# Patient Record
Sex: Female | Born: 1980 | Race: Black or African American | Hispanic: No | Marital: Single | Smoking: Never smoker
Health system: Southern US, Community
[De-identification: ages and names within clinical notes are randomized; demographics above are authoritative.]

## PROBLEM LIST (undated history)

## (undated) DIAGNOSIS — F419 Anxiety disorder, unspecified: Secondary | ICD-10-CM

## (undated) DIAGNOSIS — M797 Fibromyalgia: Secondary | ICD-10-CM

## (undated) DIAGNOSIS — E0590 Thyrotoxicosis, unspecified without thyrotoxic crisis or storm: Secondary | ICD-10-CM

## (undated) DIAGNOSIS — C50919 Malignant neoplasm of unspecified site of unspecified female breast: Secondary | ICD-10-CM

## (undated) DIAGNOSIS — G629 Polyneuropathy, unspecified: Secondary | ICD-10-CM

## (undated) DIAGNOSIS — J45909 Unspecified asthma, uncomplicated: Secondary | ICD-10-CM

## (undated) DIAGNOSIS — I2699 Other pulmonary embolism without acute cor pulmonale: Secondary | ICD-10-CM

## (undated) DIAGNOSIS — C801 Malignant (primary) neoplasm, unspecified: Secondary | ICD-10-CM

## (undated) DIAGNOSIS — G43909 Migraine, unspecified, not intractable, without status migrainosus: Secondary | ICD-10-CM

## (undated) HISTORY — PX: KNEE SURGERY: SHX244

## (undated) HISTORY — PX: CHOLECYSTECTOMY: SHX55

## (undated) HISTORY — PX: OVARY SURGERY: SHX727

## (undated) HISTORY — PX: TOOTH EXTRACTION: SUR596

## (undated) HISTORY — PX: TONSILLECTOMY: SUR1361

---

## 2003-04-11 ENCOUNTER — Ambulatory Visit (HOSPITAL_COMMUNITY): Admission: RE | Admit: 2003-04-11 | Discharge: 2003-04-12 | Payer: Self-pay | Admitting: Family Medicine

## 2014-05-04 HISTORY — PX: BILATERAL SALPINGECTOMY: SHX5743

## 2019-03-11 ENCOUNTER — Emergency Department (HOSPITAL_COMMUNITY): Payer: Self-pay

## 2019-03-11 ENCOUNTER — Other Ambulatory Visit: Payer: Self-pay

## 2019-03-11 ENCOUNTER — Emergency Department (HOSPITAL_COMMUNITY)
Admission: EM | Admit: 2019-03-11 | Discharge: 2019-03-11 | Disposition: A | Discharge status: Home / Self Care | Payer: Self-pay | Attending: Emergency Medicine | Admitting: Emergency Medicine

## 2019-03-11 ENCOUNTER — Encounter (HOSPITAL_COMMUNITY): Payer: Self-pay

## 2019-03-11 DIAGNOSIS — R1031 Right lower quadrant pain: Secondary | ICD-10-CM

## 2019-03-11 DIAGNOSIS — Z79899 Other long term (current) drug therapy: Secondary | ICD-10-CM | POA: Insufficient documentation

## 2019-03-11 DIAGNOSIS — N83291 Other ovarian cyst, right side: Secondary | ICD-10-CM | POA: Insufficient documentation

## 2019-03-11 DIAGNOSIS — N83201 Unspecified ovarian cyst, right side: Secondary | ICD-10-CM

## 2019-03-11 LAB — COMPREHENSIVE METABOLIC PANEL
ALT: 19 U/L (ref 0–44)
AST: 23 U/L (ref 15–41)
Albumin: 3.9 g/dL (ref 3.5–5.0)
Alkaline Phosphatase: 33 U/L — ABNORMAL LOW (ref 38–126)
Anion gap: 9 (ref 5–15)
BUN: 10 mg/dL (ref 6–20)
CO2: 20 mmol/L — ABNORMAL LOW (ref 22–32)
Calcium: 8.9 mg/dL (ref 8.9–10.3)
Chloride: 109 mmol/L (ref 98–111)
Creatinine, Ser: 0.97 mg/dL (ref 0.44–1.00)
GFR calc Af Amer: >60 mL/min (ref >60)
GFR calc non Af Amer: >60 mL/min (ref >60)
Glucose, Bld: 158 mg/dL — ABNORMAL HIGH (ref 70–99)
Potassium: 3.6 mmol/L (ref 3.5–5.1)
Sodium: 138 mmol/L (ref 135–145)
Total Bilirubin: 1.5 mg/dL — ABNORMAL HIGH (ref 0.3–1.2)
Total Protein: 6.8 g/dL (ref 6.5–8.1)

## 2019-03-11 LAB — CBC
HCT: 40.3 % (ref 36.0–46.0)
Hemoglobin: 13.3 g/dL (ref 12.0–15.0)
MCH: 34.4 pg — ABNORMAL HIGH (ref 26.0–34.0)
MCHC: 33 g/dL (ref 30.0–36.0)
MCV: 104.1 fL — ABNORMAL HIGH (ref 80.0–100.0)
Platelets: 306 10*3/uL (ref 150–400)
RBC: 3.87 MIL/uL (ref 3.87–5.11)
RDW: 12.4 % (ref 11.5–15.5)
WBC: 11.1 10*3/uL — ABNORMAL HIGH (ref 4.0–10.5)
nRBC: 0 % (ref 0.0–0.2)

## 2019-03-11 LAB — URINALYSIS, ROUTINE W REFLEX MICROSCOPIC
Bacteria, UA: NONE SEEN
Bilirubin Urine: NEGATIVE
Glucose, UA: NEGATIVE mg/dL
Ketones, ur: NEGATIVE mg/dL
Leukocytes,Ua: NEGATIVE
Nitrite: NEGATIVE
Protein, ur: 30 mg/dL — AB
Specific Gravity, Urine: 1.034 — ABNORMAL HIGH (ref 1.005–1.030)
pH: 7 (ref 5.0–8.0)

## 2019-03-11 LAB — LIPASE, BLOOD: Lipase: 24 U/L (ref 11–51)

## 2019-03-11 LAB — I-STAT BETA HCG BLOOD, ED (MC, WL, AP ONLY): I-stat hCG, quantitative: <5 m[IU]/mL (ref <5)

## 2019-03-11 MED ORDER — ONDANSETRON 4 MG PO TBDP
4.0000 mg | ORAL_TABLET | Freq: Once | ORAL | Status: AC | PRN
  Administered 2019-03-11: 4 mg via ORAL
  Filled 2019-03-11: qty 1

## 2019-03-11 MED ORDER — KETOROLAC TROMETHAMINE 30 MG/ML IJ SOLN
30.0000 mg | Freq: Once | INTRAMUSCULAR | Status: AC
  Administered 2019-03-11: 30 mg via INTRAVENOUS
  Filled 2019-03-11: qty 1

## 2019-03-11 MED ORDER — MORPHINE SULFATE (PF) 4 MG/ML IV SOLN
4.0000 mg | Freq: Once | INTRAVENOUS | Status: AC
  Administered 2019-03-11: 4 mg via INTRAVENOUS
  Filled 2019-03-11: qty 1

## 2019-03-11 MED ORDER — ONDANSETRON HCL 4 MG/2ML IJ SOLN
4.0000 mg | Freq: Once | INTRAMUSCULAR | Status: AC
  Administered 2019-03-11: 4 mg via INTRAVENOUS
  Filled 2019-03-11: qty 2

## 2019-03-11 MED ORDER — ONDANSETRON 4 MG PO TBDP: 4.0000 mg | ORAL_TABLET | Freq: Three times a day (TID) | ORAL | 0 refills | Status: AC | PRN

## 2019-03-11 MED ORDER — NAPROXEN 500 MG PO TABS: 500.0000 mg | ORAL_TABLET | Freq: Two times a day (BID) | ORAL | 0 refills | Status: AC | PRN

## 2019-03-11 MED ORDER — SODIUM CHLORIDE 0.9% FLUSH: 3.0000 mL | Freq: Once | INTRAVENOUS | Status: DC

## 2019-03-11 MED ORDER — HYDROMORPHONE HCL 1 MG/ML IJ SOLN
1.0000 mg | Freq: Once | INTRAMUSCULAR | Status: AC
  Administered 2019-03-11: 1 mg via INTRAVENOUS
  Filled 2019-03-11: qty 1

## 2019-03-11 NOTE — ED Notes (Signed)
Patient transported to Ultrasound 

## 2019-03-11 NOTE — Discharge Instructions (Addendum)
You have been seen today for abdominal pain. Please read and follow all provided instructions. Return to the emergency room for worsening condition or new concerning symptoms including worsening pain, fever, chills, nausea and vomiting that you cannot control.  Your ultrasound today shows 2 large cysts on your right ovary.  1. Medications:  Prescription has been sent to your pharmacy for naproxen.  This is an anti-inflammatory medication.  Please take as prescribed for pain.  Do not take any other anti-inflammatory medications while taking this such as Advil, Motrin, Aleve, ibuprofen.  Take this medicine with food as it can cause upset stomach. -Prescription has also been sent for Zofran.  This is a nausea medicine.  Please take as prescribed if needed.  Continue usual home medications Take medications as prescribed. Please review all of the medicines and only take them if you do not have an allergy to them.  2. Treatment: rest, drink plenty of fluids 3. Follow Up: Please follow up with your gynecologist in 1 to 2 days.  Call the office on Monday morning to schedule follow-up appointment.  It is also a possibility that you have an allergic reaction to any of the medicines that you have been prescribed - Everybody reacts differently to medications and while MOST people have no trouble with most medicines, you may have a reaction such as nausea, vomiting, rash, swelling, shortness of breath. If this is the case, please stop taking the medicine immediately and contact your physician.  ?

## 2019-03-11 NOTE — ED Triage Notes (Signed)
Pt reports RLQ pain since this morning, pt also started her menstrual cycle but states the pain feels different. Nausea no vomiting, pt a.o, nad noted

## 2019-03-11 NOTE — ED Provider Notes (Signed)
EMERGENCY DEPARTMENT Provider Note   CSN: [id number] Arrival date & time: 03/11/19  1209     History   Chief Complaint Chief Complaint  Patient presents with  . Abdominal Pain    HPI Latasha Brown is a 38 y.o. female past medical history significant for fibromyalgia, anxiety, IBS presents to emergency department today with chief complaint abdominal pain.  It was acute starting 3 hours prior to arrival.  Pain is located in right lower quadrant.  She states it is been constant and progressively worsening since onset.  She describes the pain as sharp. Pain is 9/10 in severity.  Tried taking her home dose of tramadol without symptom improvement.  She states her menstrual cycle started today but the pain she is having feels different than her typical menstrual cramps.  She denies fever, chills, chest pain, shortness of breath, nausea, vomiting, dysuria, urinary frequency, diarrhea, pelvic pain, vaginal bleeding, vaginal discharge.  Abdominal surgical history includes cholecystectomy. Patient does not have fallopian tubes or left ovary.  Does have a right ovary and uterus.   History reviewed. No pertinent past medical history.  There are no active problems to display for this patient.   Past Surgical History:  Procedure Laterality Date  . CHOLECYSTECTOMY    . OVARY SURGERY       OB History   No obstetric history on file.      Home Medications    Prior to Admission medications   Medication Sig Start Date End Date Taking? Authorizing Provider  acetaminophen (TYLENOL) 325 MG tablet Take 650 mg by mouth every 6 (six) hours as needed.   Yes [provider]  albuterol (VENTOLIN HFA) 108 (90 Base) MCG/ACT inhaler Inhale 2 puffs into the lungs daily as needed for shortness of breath. 05/12/18  Yes [provider]  citalopram (CELEXA) 40 MG tablet Take 1 tablet by mouth daily. 03/02/19 02/25/20 Yes [provider]  ibuprofen  (ADVIL) 200 MG tablet Take 600 mg by mouth every 6 (six) hours as needed for moderate pain.   Yes [provider]  norethindrone (MICRONOR) 0.35 MG tablet Take 1 tablet by mouth daily. 05/12/18 05/12/19 Yes [provider]  SUMAtriptan (IMITREX) 50 MG tablet Take 50 mg by mouth daily as needed for migraine. 12/07/18  Yes [provider]  topiramate (TOPAMAX) 25 MG tablet Take 1 tablet by mouth daily. 02/02/19 02/02/20 Yes [provider]  traMADol (ULTRAM) 50 MG tablet Take 1 tablet by mouth daily as needed for pain. 08/17/16  Yes [provider]  naproxen (NAPROSYN) 500 MG tablet Take 1 tablet (500 mg total) by mouth 2 (two) times daily as needed for up to 14 days. 03/11/19 03/25/19  Albrizze, Kaitlyn E, PA-C  ondansetron (ZOFRAN ODT) 4 MG disintegrating tablet Take 1 tablet (4 mg total) by mouth every 8 (eight) hours as needed for nausea or vomiting. 03/11/19   Albrizze, Harley Hallmark, PA-C    Family History No family history on file.  Social History Social History   Tobacco Use  . Smoking status: Not on file  Substance Use Topics  . Alcohol use: Not on file  . Drug use: Not on file     Allergies   Trazodone and nefazodone   Review of Systems Review of Systems  Constitutional: Negative for chills and fever.  HENT: Negative for congestion, ear discharge, ear pain, sinus pressure, sinus pain and sore throat.   Eyes: Negative for pain and redness.  Respiratory: Negative for cough and shortness of breath.   Cardiovascular: Negative for chest pain.  Gastrointestinal: Positive for abdominal pain. Negative for constipation, diarrhea, nausea and vomiting.  Genitourinary: Negative for dysuria and hematuria.  Musculoskeletal: Negative for back pain and neck pain.  Skin: Negative for wound.  Neurological: Negative for weakness, numbness and headaches.     Physical Exam Updated Vital Signs BP 137/75   Pulse 74   Temp 97.8 F (36.6 C) (Oral)   Resp  18   LMP 03/11/2019   SpO2 99%   Physical Exam Vitals signs and nursing note reviewed.  Constitutional:      General: She is not in acute distress.    Appearance: She is not ill-appearing.  HENT:     Head: Normocephalic and atraumatic.     Right Ear: Tympanic membrane and external ear normal.     Left Ear: Tympanic membrane and external ear normal.     Nose: Nose normal.     Mouth/Throat:     Mouth: Mucous membranes are moist.     Pharynx: Oropharynx is clear.  Eyes:     General: No scleral icterus.       Right eye: No discharge.        Left eye: No discharge.     Extraocular Movements: Extraocular movements intact.     Conjunctiva/sclera: Conjunctivae normal.     Pupils: Pupils are equal, round, and reactive to light.  Neck:     Musculoskeletal: Normal range of motion.     Vascular: No JVD.  Cardiovascular:     Rate and Rhythm: Normal rate and regular rhythm.     Pulses: Normal pulses.          Radial pulses are 2+ on the right side and 2+ on the left side.     Heart sounds: Normal heart sounds.  Pulmonary:     Comments: Lungs clear to auscultation in all fields. Symmetric chest rise. No wheezing, rales, or rhonchi. Abdominal:     General: Bowel sounds are normal.     Tenderness: There is abdominal tenderness in the right lower quadrant. There is no right CVA tenderness or left CVA tenderness. Negative signs include Rovsing's sign, McBurney's sign and psoas sign.     Comments: Abdomen is soft, non-distended, and non-tender in all quadrants. No rigidity, no guarding. No peritoneal signs.  Genitourinary:    Comments: Pt declines pelvic exam Musculoskeletal: Normal range of motion.  Skin:    General: Skin is warm and dry.     Capillary Refill: Capillary refill takes less than 2 seconds.  Neurological:     Mental Status: She is oriented to person, place, and time.     GCS: GCS eye subscore is 4. GCS verbal subscore is 5. GCS motor subscore is 6.     Comments: Fluent  speech, no facial droop.  Psychiatric:        Behavior: Behavior normal.      ED Treatments / Results  Labs (all labs ordered are listed, but only abnormal results are displayed) Labs Reviewed  COMPREHENSIVE METABOLIC PANEL - Abnormal; Notable for the following components:      Result Value   CO2 20 (*)    Glucose, Bld 158 (*)    Alkaline Phosphatase 33 (*)    Total Bilirubin 1.5 (*)    All other components within normal limits  CBC - Abnormal; Notable for the following components:   WBC 11.1 (*)    MCV 104.1 (*)  MCH 34.4 (*)    All other components within normal limits  URINALYSIS, ROUTINE W REFLEX MICROSCOPIC - Abnormal; Notable for the following components:   APPearance HAZY (*)    Specific Gravity, Urine 1.034 (*)    Hgb urine dipstick MODERATE (*)    Protein, ur 30 (*)    All other components within normal limits  LIPASE, BLOOD  I-STAT BETA HCG BLOOD, ED ([facility], [facility], [facility] ONLY)    EKG None  Radiology US Pelvic Complete W Transvaginal And Torsion R/o  Result Date: 03/11/2019 CLINICAL DATA:  Patient right-sided pelvic pain. EXAM: TRANSABDOMINAL AND TRANSVAGINAL ULTRASOUND OF PELVIS DOPPLER ULTRASOUND OF OVARIES TECHNIQUE: Both transabdominal and transvaginal ultrasound examinations of the pelvis were performed. Transabdominal technique was performed for global imaging of the pelvis including uterus, ovaries, adnexal regions, and pelvic cul-de-sac. It was necessary to proceed with endovaginal exam following the transabdominal exam to visualize the adnexal structure. Color and duplex Doppler ultrasound was utilized to evaluate blood flow to the ovaries. COMPARISON:  None. FINDINGS: Uterus Measurements: 9.7 x 3.9 x 4.2 cm = volume: 82.5 mL. No fibroids or other mass visualized. Endometrium Thickness: 6 mm.  No focal abnormality visualized. Right ovary Measurements: 7.6 x 6.7 x 4.5 cm = volume: 120.3 mL. Within the right ovary there is a 4.2 x 3.1 x 4.0 cm mildly complicated cyst  and a 3.8 x 3.8 x 4.0 cm mildly complicated cyst. Left ovary Surgically absent Pulsed Doppler evaluation of the right ovary demonstrates normal low-resistance arterial and venous waveforms. Other findings Trace fluid in the pelvis. IMPRESSION: The right ovary is enlarged measuring up to 7 cm and contains 2 large mildly complicated cysts. Arterial and venous flow is demonstrated within the right ovary on current examination. Given the size of the ovary and associated cysts, intermittent torsion as a cause of pain is not entirely excluded. Recommend follow-up pelvic ultrasound in 6-8 weeks to assess for interval improvement/resolution of large cysts within the right ovary. Electronically Signed   By: Lovey Newcomer M.D.   On: 03/11/2019 15:33    Procedures Procedures (including critical care time)  Medications Ordered in ED Medications  ondansetron (ZOFRAN-ODT) disintegrating tablet 4 mg (4 mg Oral Given 03/11/19 1228)  morphine 4 MG/ML injection 4 mg (4 mg Intravenous Given 03/11/19 1432)  ketorolac (TORADOL) 30 MG/ML injection 30 mg (30 mg Intravenous Given 03/11/19 1722)  HYDROmorphone (DILAUDID) injection 1 mg (1 mg Intravenous Given 03/11/19 1816)  ondansetron (ZOFRAN) injection 4 mg (4 mg Intravenous Given 03/11/19 1816)     Initial Impression / Assessment and Plan / ED Course  I have reviewed the triage vital signs and the nursing notes.  Pertinent labs & imaging results that were available during my care of the patient were reviewed by me and considered in my medical decision making (see chart for details).   Patient presents to the ED with complaints of abdominal pain. Patient nontoxic appearing, in no apparent distress, vitals WNL. On exam patient tender to RLQ, no peritoneal signs. Will evaluate with labs and pelvic US to rule out torsion. Analgesics, anti-emetics, and fluids administered.   Labs reviewed and grossly unremarkable. Mild leukocytosis of 11.1. Hemoglobin stable at 13.3. No  significant electrolyte derangements. LFTs, renal function, and lipase WNL. Urinalysis without obvious infection, does have moderate blood consistent with current menses.  US shows right ovary with 2 large complicated cysts. Radiologist comments on arterial and venous flow seen to right ovary making torsion less likely.  Pt updated  on US findings. She reports she knew of one cyst, but not the other. She had pelvic exam last week by pcp for annual exam. She declines pelvic exam today. Pt given dilaudid and toradol for pain with symptom improvement. She is requesting to be discharged home now that pain has improved. She is tolerating PO intake while in ED. Repeat abdominal exam is benign.  Will discharge home with supportive measures including Naproxen. I discussed results, treatment plan, need for close PCP and gyn follow-up, and return precautions with the patient. She was informed of recommendation for repeat US in 6-8 weeks. Provided opportunity for questions, patient confirmed understanding and is in agreement with plan.     Portions of this note were generated with [employer]. Dictation errors may occur despite best attempts at proofreading.    Final Clinical Impressions(s) / ED Diagnoses   Final diagnoses:  Cyst of right ovary    ED Discharge Orders         Ordered    naproxen (NAPROSYN) 500 MG tablet  2 times daily PRN     03/11/19 1751    ondansetron (ZOFRAN ODT) 4 MG disintegrating tablet  Every 8 hours PRN     03/11/19 1754           Albrizze, Harley Hallmark, PA-C 03/11/19 2228    Lennice Sites, DO 03/12/19 1243

## 2019-10-29 ENCOUNTER — Emergency Department (HOSPITAL_COMMUNITY)
Admission: EM | Admit: 2019-10-29 | Discharge: 2019-10-30 | Disposition: A | Discharge status: Home / Self Care | Payer: Self-pay | Attending: Emergency Medicine | Admitting: Emergency Medicine

## 2019-10-29 ENCOUNTER — Encounter (HOSPITAL_COMMUNITY): Payer: Self-pay | Admitting: Emergency Medicine

## 2019-10-29 ENCOUNTER — Other Ambulatory Visit: Payer: Self-pay

## 2019-10-29 DIAGNOSIS — Z79899 Other long term (current) drug therapy: Secondary | ICD-10-CM | POA: Insufficient documentation

## 2019-10-29 DIAGNOSIS — G43809 Other migraine, not intractable, without status migrainosus: Secondary | ICD-10-CM | POA: Insufficient documentation

## 2019-10-29 HISTORY — DX: Migraine, unspecified, not intractable, without status migrainosus: G43.909

## 2019-10-29 HISTORY — DX: Anxiety disorder, unspecified: F41.9

## 2019-10-29 HISTORY — DX: Unspecified asthma, uncomplicated: J45.909

## 2019-10-29 HISTORY — DX: Fibromyalgia: M79.7

## 2019-10-29 NOTE — ED Triage Notes (Signed)
Pt reports migraine since Friday.  States she has a history of migraine and had refills on Imitrex but the Rx expired.  Reports nausea and light sensitivity.  Took nausea medication this morning with some relief or nausea.

## 2019-10-30 MED ORDER — SUMATRIPTAN SUCCINATE 50 MG PO TABS: 50.0000 mg | ORAL_TABLET | Freq: Every day | ORAL | 0 refills | Status: AC | PRN

## 2019-10-30 MED ORDER — KETOROLAC TROMETHAMINE 60 MG/2ML IM SOLN
60.0000 mg | Freq: Once | INTRAMUSCULAR | Status: AC
  Administered 2019-10-30: 60 mg via INTRAMUSCULAR
  Filled 2019-10-30: qty 2

## 2019-10-30 MED ORDER — SUMATRIPTAN SUCCINATE 6 MG/0.5ML ~~LOC~~ SOLN
6.0000 mg | Freq: Once | SUBCUTANEOUS | Status: AC
  Administered 2019-10-30: 6 mg via SUBCUTANEOUS
  Filled 2019-10-30: qty 0.5

## 2019-10-30 NOTE — ED Provider Notes (Signed)
[facility] EMERGENCY DEPARTMENT Provider Note   CSN: [id number] Arrival date & time: 10/29/19  1749     History Chief Complaint  Patient presents with  . Migraine    Latasha Brown is a 39 y.o. female.  History of migraines and normally takes Imitrex has a prescription with refills but is expired and can get your doctor as we can.  Is nothing else is changed about the headache.  No neurologic symptoms.  No infectious symptoms.   Migraine       Past Medical History:  Diagnosis Date  . Anxiety   . Asthma   . Fibromyalgia   . Migraine     There are no problems to display for this patient.   Past Surgical History:  Procedure Laterality Date  . CHOLECYSTECTOMY    . KNEE SURGERY    . OVARY SURGERY    . TONSILLECTOMY    . TOOTH EXTRACTION       OB History   No obstetric history on file.     No family history on file.  Social History   Tobacco Use  . Smoking status: Never Smoker  . Smokeless tobacco: Never Used  Substance Use Topics  . Alcohol use: Yes  . Drug use: Not Currently    Home Medications Prior to Admission medications   Medication Sig Start Date End Date Taking? Authorizing Provider  acetaminophen (TYLENOL) 325 MG tablet Take 650 mg by mouth every 6 (six) hours as needed.    [provider]  albuterol (VENTOLIN HFA) 108 (90 Base) MCG/ACT inhaler Inhale 2 puffs into the lungs daily as needed for shortness of breath. 05/12/18   [provider]  citalopram (CELEXA) 40 MG tablet Take 1 tablet by mouth daily. 03/02/19 02/25/20  [provider]  ibuprofen (ADVIL) 200 MG tablet Take 600 mg by mouth every 6 (six) hours as needed for moderate pain.    [provider]  norethindrone (MICRONOR) 0.35 MG tablet Take 1 tablet by mouth daily. 05/12/18 05/12/19  [provider]  ondansetron (ZOFRAN ODT) 4 MG disintegrating tablet Take 1 tablet (4 mg total) by mouth every 8 (eight) hours as needed  for nausea or vomiting. 03/11/19   Albrizze, Kaitlyn E, PA-C  SUMAtriptan (IMITREX) 50 MG tablet Take 1 tablet (50 mg total) by mouth daily as needed for migraine. 10/30/19   Divonte Senger, Corene Cornea, MD  topiramate (TOPAMAX) 25 MG tablet Take 1 tablet by mouth daily. 02/02/19 02/02/20  [provider]  traMADol (ULTRAM) 50 MG tablet Take 1 tablet by mouth daily as needed for pain. 08/17/16   [provider]    Allergies    Trazodone and nefazodone  Review of Systems   Review of Systems  All other systems reviewed and are negative.   Physical Exam Updated Vital Signs BP 117/64   Pulse 71   Temp 98.5 F (36.9 C) (Oral)   Resp 16   Ht 5\' 6"  (1.676 m)   Wt 124.3 kg   LMP 10/26/2019   SpO2 99%   BMI 44.22 kg/m   Physical Exam Vitals and nursing note reviewed.  Constitutional:      Appearance: She is well-developed.  HENT:     Head: Normocephalic and atraumatic.     Nose: No congestion or rhinorrhea.     Mouth/Throat:     Mouth: Mucous membranes are moist.     Pharynx: Oropharynx is clear.  Eyes:     Pupils: Pupils are  equal, round, and reactive to light.  Cardiovascular:     Rate and Rhythm: Normal rate and regular rhythm.  Pulmonary:     Effort: No respiratory distress.     Breath sounds: No stridor.  Abdominal:     General: There is no distension.  Musculoskeletal:        General: No swelling or tenderness. Normal range of motion.     Cervical back: Normal range of motion.  Skin:    General: Skin is warm and dry.  Neurological:     General: No focal deficit present.     Mental Status: She is alert.     Comments: No altered mental status, able to give full seemingly accurate history.  Face is symmetric, EOM's intact, pupils equal and reactive, vision intact, tongue and uvula midline without deviation. Upper and Lower extremity motor 5/5, intact pain perception in distal extremities, 2+ reflexes in biceps, patella and achilles tendons. Able to perform finger to  nose normal with both hands. Walks without assistance or evident ataxia.     ED Results / Procedures / Treatments   Labs (all labs ordered are listed, but only abnormal results are displayed) Labs Reviewed - No data to display  EKG None  Radiology No results found.  Procedures Procedures (including critical care time)  Medications Ordered in ED Medications  SUMAtriptan (IMITREX) injection 6 mg (6 mg Subcutaneous Given 10/30/19 0106)  ketorolac (TORADOL) injection 60 mg (60 mg Intramuscular Given 10/30/19 0107)    ED Course  I have reviewed the triage vital signs and the nursing notes.  Pertinent labs & imaging results that were available during my care of the patient were reviewed by me and considered in my medical decision making (see chart for details).    MDM Rules/Calculators/A&P                          Migraine resolved here with imitrex and toradol. rx written. Dc for pcp fu.   Final Clinical Impression(s) / ED Diagnoses Final diagnoses:  Other migraine without status migrainosus, not intractable    Rx / DC Orders ED Discharge Orders         Ordered    SUMAtriptan (IMITREX) 50 MG tablet  Daily PRN     Discontinue  Reprint     10/30/19 0323           Lillianah Swartzentruber, Corene Cornea, MD 10/30/19 9826

## 2019-10-30 NOTE — ED Notes (Signed)
pts pain is better 

## 2019-12-19 ENCOUNTER — Ambulatory Visit: Payer: Self-pay

## 2019-12-28 ENCOUNTER — Ambulatory Visit: Admit: 2019-12-28 | Disposition: A | Discharge status: Home / Self Care | Payer: Self-pay

## 2020-06-08 ENCOUNTER — Emergency Department (HOSPITAL_BASED_OUTPATIENT_CLINIC_OR_DEPARTMENT_OTHER): Payer: Medicaid Other

## 2020-06-08 ENCOUNTER — Emergency Department (HOSPITAL_BASED_OUTPATIENT_CLINIC_OR_DEPARTMENT_OTHER)
Admission: EM | Admit: 2020-06-08 | Discharge: 2020-06-08 | Disposition: A | Discharge status: Home / Self Care | Payer: Medicaid Other | Attending: Emergency Medicine | Admitting: Emergency Medicine

## 2020-06-08 ENCOUNTER — Encounter (HOSPITAL_BASED_OUTPATIENT_CLINIC_OR_DEPARTMENT_OTHER): Payer: Self-pay | Admitting: Emergency Medicine

## 2020-06-08 ENCOUNTER — Other Ambulatory Visit: Payer: Self-pay

## 2020-06-08 DIAGNOSIS — M549 Dorsalgia, unspecified: Secondary | ICD-10-CM | POA: Insufficient documentation

## 2020-06-08 DIAGNOSIS — Z20822 Contact with and (suspected) exposure to covid-19: Secondary | ICD-10-CM | POA: Diagnosis not present

## 2020-06-08 DIAGNOSIS — Z853 Personal history of malignant neoplasm of breast: Secondary | ICD-10-CM | POA: Insufficient documentation

## 2020-06-08 DIAGNOSIS — J45909 Unspecified asthma, uncomplicated: Secondary | ICD-10-CM | POA: Insufficient documentation

## 2020-06-08 DIAGNOSIS — R110 Nausea: Secondary | ICD-10-CM | POA: Insufficient documentation

## 2020-06-08 DIAGNOSIS — J069 Acute upper respiratory infection, unspecified: Secondary | ICD-10-CM | POA: Diagnosis not present

## 2020-06-08 DIAGNOSIS — R059 Cough, unspecified: Secondary | ICD-10-CM | POA: Diagnosis present

## 2020-06-08 LAB — COMPREHENSIVE METABOLIC PANEL
ALT: 40 U/L (ref 0–44)
AST: 33 U/L (ref 15–41)
Albumin: 3.9 g/dL (ref 3.5–5.0)
Alkaline Phosphatase: 36 U/L — ABNORMAL LOW (ref 38–126)
Anion gap: 9 (ref 5–15)
BUN: 11 mg/dL (ref 6–20)
CO2: 27 mmol/L (ref 22–32)
Calcium: 8.4 mg/dL — ABNORMAL LOW (ref 8.9–10.3)
Chloride: 103 mmol/L (ref 98–111)
Creatinine, Ser: 0.72 mg/dL (ref 0.44–1.00)
GFR, Estimated: >60 mL/min (ref >60)
Glucose, Bld: 116 mg/dL — ABNORMAL HIGH (ref 70–99)
Potassium: 3.9 mmol/L (ref 3.5–5.1)
Sodium: 139 mmol/L (ref 135–145)
Total Bilirubin: 1.2 mg/dL (ref 0.3–1.2)
Total Protein: 7.2 g/dL (ref 6.5–8.1)

## 2020-06-08 LAB — PREGNANCY, URINE: Preg Test, Ur: NEGATIVE

## 2020-06-08 LAB — URINALYSIS, ROUTINE W REFLEX MICROSCOPIC
Bilirubin Urine: NEGATIVE
Glucose, UA: NEGATIVE mg/dL
Hgb urine dipstick: NEGATIVE
Ketones, ur: NEGATIVE mg/dL
Leukocytes,Ua: NEGATIVE
Nitrite: NEGATIVE
Protein, ur: NEGATIVE mg/dL
Specific Gravity, Urine: 1.015 (ref 1.005–1.030)
pH: 7 (ref 5.0–8.0)

## 2020-06-08 LAB — CBC
HCT: 36.5 % (ref 36.0–46.0)
Hemoglobin: 12.6 g/dL (ref 12.0–15.0)
MCH: 34.7 pg — ABNORMAL HIGH (ref 26.0–34.0)
MCHC: 34.5 g/dL (ref 30.0–36.0)
MCV: 100.6 fL — ABNORMAL HIGH (ref 80.0–100.0)
Platelets: 260 10*3/uL (ref 150–400)
RBC: 3.63 MIL/uL — ABNORMAL LOW (ref 3.87–5.11)
RDW: 13 % (ref 11.5–15.5)
WBC: 3 10*3/uL — ABNORMAL LOW (ref 4.0–10.5)
nRBC: 0 % (ref 0.0–0.2)

## 2020-06-08 LAB — LIPASE, BLOOD: Lipase: 38 U/L (ref 11–51)

## 2020-06-08 MED ORDER — DIPHENHYDRAMINE HCL 50 MG/ML IJ SOLN
25.0000 mg | Freq: Once | INTRAMUSCULAR | Status: AC
Start: 2020-06-08 — End: 2020-06-08
  Administered 2020-06-08: 25 mg via INTRAVENOUS
  Filled 2020-06-08: qty 1

## 2020-06-08 MED ORDER — KETOROLAC TROMETHAMINE 30 MG/ML IJ SOLN
30.0000 mg | Freq: Once | INTRAMUSCULAR | Status: AC
  Administered 2020-06-08: 30 mg via INTRAVENOUS
  Filled 2020-06-08: qty 1

## 2020-06-08 MED ORDER — METOCLOPRAMIDE HCL 5 MG/ML IJ SOLN
10.0000 mg | Freq: Once | INTRAMUSCULAR | Status: AC
  Administered 2020-06-08: 10 mg via INTRAVENOUS
  Filled 2020-06-08: qty 2

## 2020-06-08 MED ORDER — SODIUM CHLORIDE 0.9 % IV BOLUS
500.0000 mL | Freq: Once | INTRAVENOUS | Status: AC
  Administered 2020-06-08: 500 mL via INTRAVENOUS

## 2020-06-08 NOTE — ED Notes (Addendum)
Pt declines zofran, endorses concern for migraine. Pt denies CP, denies shob

## 2020-06-08 NOTE — ED Triage Notes (Signed)
Pt arrives pov w/driver, c/o nausea, sore throat and back pain, endorses concern for reaction to Chemo or Covid. Pt receiving chemo treatments, last treatment Wednesday. Pt also endorses increase in cough.

## 2020-06-08 NOTE — Discharge Instructions (Signed)
Please read and follow all provided instructions.  Your diagnoses today include:  1. Upper respiratory tract infection, unspecified type   2. Nausea     Tests performed today include:  Vital signs. See below for your results today.   COVID test - pending, check mychart for results  Blood cell counts (white, red, and platelets) Electrolytes - white blood cell count 3.0 Kidney function test Lipase Urine test to check for infection  Medications prescribed:   None  Take any prescribed medications only as directed. Treatment for your infection is aimed at treating the symptoms. There are no medications, such as antibiotics, that will cure your infection.   Home care instructions:  Follow any educational materials contained in this packet.   Your illness is contagious and can be spread to others, especially during the first 3 or 4 days. It cannot be cured by antibiotics or other medicines. Take basic precautions such as washing your hands often, covering your mouth when you cough or sneeze, and avoiding public places where you could spread your illness to others.   Please continue drinking plenty of fluids.  Use over-the-counter medicines as needed as directed on packaging for symptom relief.  You may also use ibuprofen or tylenol as directed on packaging for pain or fever.  Do not take multiple medicines containing Tylenol or acetaminophen to avoid taking too much of this medication.  If you are positive for Covid-19, you should isolate yourself and not be exposed to other people for 5 days after your symptoms began. If you are not feeling better at day 5, you need to isolate yourself for a total of 10 days. If you are feeling better by day 5, you should wear a mask properly, over your nose and mouth, at all times while around other people until 10 days after your symptoms started.   Follow-up instructions: Please follow-up with your primary care provider as needed for further evaluation  of your symptoms if you are not feeling better.   Return instructions:   Please return to the Emergency Department if you experience worsening symptoms.   Return to the emergency department if you have worsening shortness of breath breathing or increased work of breathing, persistent vomiting  RETURN IMMEDIATELY IF you develop shortness of breath, confusion or altered mental status, a new rash, become dizzy, faint, or poorly responsive, or are unable to be cared for at home.  Please return if you have persistent vomiting and cannot keep down fluids or develop a fever that is not controlled by tylenol or motrin.    Please return if you have any other emergent concerns.  Additional Information:  Your vital signs today were: BP (!) 139/116 (BP Location: Right Arm)   Pulse 95   Temp 99.8 F (37.7 C) (Oral)   Resp 20   Ht 5\' 6"  (1.676 m)   Wt 131 kg   LMP 05/25/2020   SpO2 98%   BMI 46.61 kg/m  If your blood pressure (BP) was elevated above 135/85 this visit, please have this repeated by your doctor within one month. --------------

## 2020-06-08 NOTE — ED Provider Notes (Signed)
[facility] EMERGENCY DEPARTMENT Provider Note   CSN: [id number] Arrival date & time: 06/08/20  1720     History Chief Complaint  Patient presents with  . Nausea    Latasha Brown is a 40 y.o. female.  Patient presents to the emergency department for multiple symptoms.  Patient states that she has had cold-like/constitutional symptoms over the past 3 weeks that her doctors attributed to chemotherapy.  She is currently being treated for breast cancer and undergoes chemotherapy on Wednesdays (today is Saturday).  Over the past 2 days her upper respiratory symptoms have been worse, prompting ED visit.  These include headache, nasal congestion, sore throat, cough, and body aches.  She has also had nausea with dry heaves but no vomiting.  Body aches are worse in her back.  Cough is nonproductive.  She has not been tested for Covid.  She has received COVID vaccine.  No urine symptoms, abdominal pain or chest pain.  The onset of this condition was acute. The course is worsening. Aggravating factors: none. Alleviating factors: none.          Past Medical History:  Diagnosis Date  . Anxiety   . Asthma   . Fibromyalgia   . Migraine     There are no problems to display for this patient.   Past Surgical History:  Procedure Laterality Date  . CHOLECYSTECTOMY    . KNEE SURGERY    . OVARY SURGERY    . TONSILLECTOMY    . TOOTH EXTRACTION       OB History   No obstetric history on file.     History reviewed. No pertinent family history.  Social History   Tobacco Use  . Smoking status: Never Smoker  . Smokeless tobacco: Never Used  Substance Use Topics  . Alcohol use: Yes    Comment: occ  . Drug use: Not Currently    Types: Marijuana    Home Medications Prior to Admission medications   Medication Sig Start Date End Date Taking? Authorizing Provider  acetaminophen (TYLENOL) 325 MG tablet Take 650 mg by mouth every 6 (six) hours as needed.    [provider]  albuterol (VENTOLIN HFA) 108 (90 Base) MCG/ACT inhaler Inhale 2 puffs into the lungs daily as needed for shortness of breath. 05/12/18   [provider]  citalopram (CELEXA) 40 MG tablet Take 1 tablet by mouth daily. 03/02/19 02/25/20  [provider]  ibuprofen (ADVIL) 200 MG tablet Take 600 mg by mouth every 6 (six) hours as needed for moderate pain.    [provider]  norethindrone (MICRONOR) 0.35 MG tablet Take 1 tablet by mouth daily. 05/12/18 05/12/19  [provider]  ondansetron (ZOFRAN ODT) 4 MG disintegrating tablet Take 1 tablet (4 mg total) by mouth every 8 (eight) hours as needed for nausea or vomiting. 03/11/19   Walisiewicz, Harley Hallmark, PA-C  SUMAtriptan (IMITREX) 50 MG tablet Take 1 tablet (50 mg total) by mouth daily as needed for migraine. 10/30/19   Mesner, Corene Cornea, MD  topiramate (TOPAMAX) 25 MG tablet Take 1 tablet by mouth daily. 02/02/19 02/02/20  [provider]  traMADol (ULTRAM) 50 MG tablet Take 1 tablet by mouth daily as needed for pain. 08/17/16   [provider]    Allergies    Trazodone and nefazodone  Review of Systems   Review of Systems  Constitutional: Positive for fatigue. Negative for chills and fever.  HENT: Positive for congestion, ear pain and sore  throat. Negative for rhinorrhea and sinus pressure.   Eyes: Negative for redness.  Respiratory: Positive for cough. Negative for wheezing.   Cardiovascular: Negative for chest pain.  Gastrointestinal: Positive for nausea. Negative for abdominal pain, diarrhea and vomiting.  Genitourinary: Negative for dysuria, frequency, hematuria and urgency.  Musculoskeletal: Positive for myalgias. Negative for neck stiffness.  Skin: Negative for rash.  Neurological: Positive for headaches.  Hematological: Negative for adenopathy.    Physical Exam Updated Vital Signs BP 138/78 (BP Location: Right Arm)   Pulse 86   Temp 99.8 F (37.7 C) (Oral)   Resp 20    Ht 5\' 6"  (1.676 m)   Wt 131 kg   LMP 05/25/2020   SpO2 99%   BMI 46.61 kg/m   Physical Exam Vitals and nursing note reviewed.  Constitutional:      Appearance: She is well-developed and well-nourished.  HENT:     Head: Normocephalic and atraumatic.     Jaw: No trismus.     Right Ear: Tympanic membrane, ear canal and external ear normal.     Left Ear: Tympanic membrane, ear canal and external ear normal.     Nose: Nose normal. No mucosal edema or rhinorrhea.     Mouth/Throat:     Mouth: Oropharynx is clear and moist and mucous membranes are normal. Mucous membranes are not dry. No oral lesions.     Pharynx: Uvula midline. No oropharyngeal exudate, posterior oropharyngeal edema, posterior oropharyngeal erythema or uvula swelling.     Tonsils: No tonsillar abscesses.  Eyes:     General:        Right eye: No discharge.        Left eye: No discharge.     Conjunctiva/sclera: Conjunctivae normal.  Cardiovascular:     Rate and Rhythm: Normal rate and regular rhythm.     Heart sounds: Normal heart sounds.  Pulmonary:     Effort: Pulmonary effort is normal. No respiratory distress.     Breath sounds: Normal breath sounds. No wheezing or rales.  Abdominal:     Palpations: Abdomen is soft.     Tenderness: There is no abdominal tenderness.  Musculoskeletal:     Cervical back: Normal range of motion and neck supple.  Lymphadenopathy:     Cervical: No cervical adenopathy.  Skin:    General: Skin is warm and dry.  Neurological:     Mental Status: She is alert.  Psychiatric:        Mood and Affect: Mood and affect normal.     ED Results / Procedures / Treatments   Labs (all labs ordered are listed, but only abnormal results are displayed) Labs Reviewed  COMPREHENSIVE METABOLIC PANEL - Abnormal; Notable for the following components:      Result Value   Glucose, Bld 116 (*)    Calcium 8.4 (*)    Alkaline Phosphatase 36 (*)    All other components within normal limits  CBC -  Abnormal; Notable for the following components:   WBC 3.0 (*)    RBC 3.63 (*)    MCV 100.6 (*)    MCH 34.7 (*)    All other components within normal limits  SARS CORONAVIRUS 2 (TAT 6-24 HRS)  LIPASE, BLOOD  URINALYSIS, ROUTINE W REFLEX MICROSCOPIC  PREGNANCY, URINE    EKG None  Radiology DG Chest Port 1 View  Result Date: 06/08/2020 CLINICAL DATA:  Persistent cough EXAM: PORTABLE CHEST 1 VIEW COMPARISON:  None. FINDINGS: Single frontal view of the  chest demonstrates right chest wall port via internal jugular approach tip overlying superior vena cava. Cardiac silhouette is unremarkable. No airspace disease, effusion, or pneumothorax. No acute bony abnormalities. IMPRESSION: 1. No acute intrathoracic process. Electronically Signed   By: Randa Ngo M.D.   On: 06/08/2020 21:48    Procedures Procedures   Medications Ordered in ED Medications  metoCLOPramide (REGLAN) injection 10 mg (10 mg Intravenous Given 06/08/20 2057)  diphenhydrAMINE (BENADRYL) injection 25 mg (25 mg Intravenous Given 06/08/20 2055)  sodium chloride 0.9 % bolus 500 mL ( Intravenous Stopped 06/08/20 2149)  ketorolac (TORADOL) 30 MG/ML injection 30 mg (30 mg Intravenous Given 06/08/20 2220)    ED Course  I have reviewed the triage vital signs and the nursing notes.  Pertinent labs & imaging results that were available during my care of the patient were reviewed by me and considered in my medical decision making (see chart for details).  Clinical Course as of 06/08/20 2157  Sat Jun 08, 2020  2001 WBC(!): 3.0 [KC]  2150 SARS CORONAVIRUS 2 (TAT 6-24 HRS) Nasopharyngeal Nasopharyngeal Swab [KC]    Clinical Course User Index [KC] Corless, Fredrik Rigger, Student-PA   Patient seen and examined.  Work-up reviewed.  Added Covid testing, migraine cocktail, chest x-ray.  White blood cell count is 3000.   Vital signs reviewed and are as follows: BP 138/78 (BP Location: Right Arm)   Pulse 86   Temp 99.8 F (37.7 C) (Oral)    Resp 20   Ht 5\' 6"  (1.676 m)   Wt 131 kg   LMP 05/25/2020   SpO2 99%   BMI 46.61 kg/m   Patient feeling better after hydration and medication.  Still with a sore throat, Toradol ordered for this.  She has tolerated fluids.  Chest x-ray does not demonstrate pneumonia.    Plan for discharged home at this time.  She states that she has medications for nausea.  Discussed follow-up Covid test.  Patient urged to return with worsening symptoms or other concerns. Patient verbalized understanding and agrees with plan.    MDM Rules/Calculators/A&P                          Patient with URI symptoms and nausea without vomiting.  Lab work-up is reassuring.  Chest x-ray without signs of pneumonia.  Patient was treated in the ED for headache with Reglan and Benadryl and sore throat with Toradol.  She was given IV fluids.  She appears well, nontoxic.  She appears safe for discharge home at this time.  Covid test was ordered and is pending.    Final Clinical Impression(s) / ED Diagnoses Final diagnoses:  Upper respiratory tract infection, unspecified type  Nausea    Rx / DC Orders ED Discharge Orders    None       Suann Larry 06/08/20 2329    Lajean Saver, MD 06/08/20 2335

## 2020-06-09 LAB — SARS CORONAVIRUS 2 (TAT 6-24 HRS): SARS Coronavirus 2: NEGATIVE

## 2020-06-19 ENCOUNTER — Other Ambulatory Visit: Payer: Self-pay

## 2020-06-19 ENCOUNTER — Encounter (HOSPITAL_COMMUNITY): Payer: Self-pay

## 2020-06-19 ENCOUNTER — Emergency Department (HOSPITAL_COMMUNITY)
Admission: EM | Admit: 2020-06-19 | Discharge: 2020-06-20 | Disposition: A | Discharge status: Home / Self Care | Payer: Medicaid Other | Attending: Emergency Medicine | Admitting: Emergency Medicine

## 2020-06-19 DIAGNOSIS — E010 Iodine-deficiency related diffuse (endemic) goiter: Secondary | ICD-10-CM | POA: Insufficient documentation

## 2020-06-19 DIAGNOSIS — J45909 Unspecified asthma, uncomplicated: Secondary | ICD-10-CM | POA: Insufficient documentation

## 2020-06-19 DIAGNOSIS — R040 Epistaxis: Secondary | ICD-10-CM | POA: Insufficient documentation

## 2020-06-19 DIAGNOSIS — Z853 Personal history of malignant neoplasm of breast: Secondary | ICD-10-CM | POA: Insufficient documentation

## 2020-06-19 DIAGNOSIS — R519 Headache, unspecified: Secondary | ICD-10-CM | POA: Diagnosis present

## 2020-06-19 MED ORDER — OXYMETAZOLINE HCL 0.05 % NA SOLN: 1.0000 | Freq: Once | NASAL | Status: DC

## 2020-06-19 NOTE — ED Triage Notes (Signed)
Patient reports severe headache, neck pain, bloody nose, reports she had chemo this morning at [facility], denies any fever, concerned for chemo reaction.

## 2020-06-19 NOTE — ED Notes (Signed)
Patient was started on Eliquis on Friday for acute finding of PE

## 2020-06-20 ENCOUNTER — Emergency Department (HOSPITAL_COMMUNITY): Payer: Medicaid Other

## 2020-06-20 LAB — COMPREHENSIVE METABOLIC PANEL
ALT: 37 U/L (ref 0–44)
AST: 36 U/L (ref 15–41)
Albumin: 3.1 g/dL — ABNORMAL LOW (ref 3.5–5.0)
Alkaline Phosphatase: 38 U/L (ref 38–126)
Anion gap: 11 (ref 5–15)
BUN: 10 mg/dL (ref 6–20)
CO2: 22 mmol/L (ref 22–32)
Calcium: 8.2 mg/dL — ABNORMAL LOW (ref 8.9–10.3)
Chloride: 104 mmol/L (ref 98–111)
Creatinine, Ser: 0.66 mg/dL (ref 0.44–1.00)
GFR, Estimated: >60 mL/min (ref >60)
Glucose, Bld: 144 mg/dL — ABNORMAL HIGH (ref 70–99)
Potassium: 4.3 mmol/L (ref 3.5–5.1)
Sodium: 137 mmol/L (ref 135–145)
Total Bilirubin: 0.7 mg/dL (ref 0.3–1.2)
Total Protein: 6.5 g/dL (ref 6.5–8.1)

## 2020-06-20 LAB — CBC
HCT: 29.9 % — ABNORMAL LOW (ref 36.0–46.0)
Hemoglobin: 10.8 g/dL — ABNORMAL LOW (ref 12.0–15.0)
MCH: 34.4 pg — ABNORMAL HIGH (ref 26.0–34.0)
MCHC: 36.1 g/dL — ABNORMAL HIGH (ref 30.0–36.0)
MCV: 95.2 fL (ref 80.0–100.0)
Platelets: 205 10*3/uL (ref 150–400)
RBC: 3.14 MIL/uL — ABNORMAL LOW (ref 3.87–5.11)
RDW: 13 % (ref 11.5–15.5)
WBC: 3.4 10*3/uL — ABNORMAL LOW (ref 4.0–10.5)
nRBC: 0 % (ref 0.0–0.2)

## 2020-06-20 LAB — I-STAT BETA HCG BLOOD, ED (MC, WL, AP ONLY): I-stat hCG, quantitative: <5 m[IU]/mL (ref <5)

## 2020-06-20 MED ORDER — DEXAMETHASONE SODIUM PHOSPHATE 10 MG/ML IJ SOLN
10.0000 mg | Freq: Once | INTRAMUSCULAR | Status: AC
  Administered 2020-06-20: 10 mg via INTRAVENOUS
  Filled 2020-06-20: qty 1

## 2020-06-20 MED ORDER — DIPHENHYDRAMINE HCL 50 MG/ML IJ SOLN
INTRAMUSCULAR | Status: AC
  Filled 2020-06-20: qty 1

## 2020-06-20 MED ORDER — SODIUM CHLORIDE 0.9 % IV BOLUS
1000.0000 mL | Freq: Once | INTRAVENOUS | Status: AC
  Administered 2020-06-20: 1000 mL via INTRAVENOUS

## 2020-06-20 MED ORDER — PROCHLORPERAZINE EDISYLATE 10 MG/2ML IJ SOLN
10.0000 mg | Freq: Once | INTRAMUSCULAR | Status: AC
  Administered 2020-06-20: 10 mg via INTRAVENOUS
  Filled 2020-06-20: qty 2

## 2020-06-20 MED ORDER — DIPHENHYDRAMINE HCL 50 MG/ML IJ SOLN
25.0000 mg | Freq: Once | INTRAMUSCULAR | Status: AC
  Administered 2020-06-20: 25 mg via INTRAVENOUS
  Filled 2020-06-20: qty 1

## 2020-06-20 NOTE — ED Provider Notes (Signed)
[facility] EMERGENCY DEPARTMENT Provider Note   CSN: [id number] Arrival date & time: 06/19/20  2342     History Chief Complaint  Patient presents with  . Migraine  . Neck Pain  . Epistaxis  . Chemotherapy    Latasha Brown is a 40 y.o. female with PMH of left-sided breast cancer without evidence of metastatic disease per PET scan obtained 04/30/2020 on chemotherapy who was also recently started on Eliquis for pulmonary embolism who presents the ED with complaints of severe headache, neck discomfort, and epistaxis.  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  It was noted that she would likely transition to hypothyroidism in the next 3 to 6 weeks.  Recommended 800 mg ibuprofen 3 times daily for her painful enlargement of thyroid by her Endo-oncologist at [facility].  On my examination, patient states that this was around 7 for chemotherapy. After her chemotherapy this morning, she went and had her echocardiogram done which was reportedly normal/unchanged. She drove home and then had [employer] with her son. She endorses a history of migraine disorder and states that she had a mild headache throughout the day, but then it worsened at 9:30 PM while her son was dribbling a basketball. She states that is the worst headache of her life. She also feels as though she is having difficulty swallowing and sore throat symptoms due to her enlarged thyroid. She had an epistaxis with onset of her headache symptoms which resolved with compression. She then states that she took atenolol, recently prescribed, which caused her to feel as though she might pass out. She had a sit down in a chair, but then the symptoms abated after a few minutes. Given her collection of symptoms, she called her on-call provider who encouraged  her to come to the ED for evaluation.  She denies any current bleeding, chest pain or shortness of breath, abdominal discomfort, difficulty opening her mouth, inability to eat or drink, fevers or chills, numbness or weakness, blurred vision, or other focal deficits. She has been taking all of her medications, including her Eliquis, as directed. She states that her sore throat symptoms were largely unimproved with the ibuprofen. She believes that her oncologist said that steroids might be an appropriate alternative.  HPI     Past Medical History:  Diagnosis Date  . Anxiety   . Asthma   . Fibromyalgia   . Migraine     There are no problems to display for this patient.   Past Surgical History:  Procedure Laterality Date  . CHOLECYSTECTOMY    . KNEE SURGERY    . OVARY SURGERY    . TONSILLECTOMY    . TOOTH EXTRACTION       OB History   No obstetric history on file.     History reviewed. No pertinent family history.  Social History   Tobacco Use  . Smoking status: Never Smoker  . Smokeless tobacco: Never Used  Substance Use Topics  . Alcohol use: Yes    Comment: occ  . Drug use: Not Currently    Types: Marijuana    Home Medications Prior to Admission medications   Medication Sig Start Date End Date Taking? Authorizing Provider  acetaminophen (TYLENOL) 325 MG tablet Take 650 mg by mouth every 6 (six) hours as needed.    [provider]  albuterol (VENTOLIN  HFA) 108 (90 Base) MCG/ACT inhaler Inhale 2 puffs into the lungs daily as needed for shortness of breath. 05/12/18   [provider]  citalopram (CELEXA) 40 MG tablet Take 1 tablet by mouth daily. 03/02/19 02/25/20  [provider]  ibuprofen (ADVIL) 200 MG tablet Take 600 mg by mouth every 6 (six) hours as needed for moderate pain.    [provider]  norethindrone (MICRONOR) 0.35 MG tablet Take 1 tablet by mouth daily. 05/12/18 05/12/19  [provider]  ondansetron (ZOFRAN  ODT) 4 MG disintegrating tablet Take 1 tablet (4 mg total) by mouth every 8 (eight) hours as needed for nausea or vomiting. 03/11/19   Walisiewicz, Harley Hallmark, PA-C  SUMAtriptan (IMITREX) 50 MG tablet Take 1 tablet (50 mg total) by mouth daily as needed for migraine. 10/30/19   Mesner, Corene Cornea, MD  topiramate (TOPAMAX) 25 MG tablet Take 1 tablet by mouth daily. 02/02/19 02/02/20  [provider]  traMADol (ULTRAM) 50 MG tablet Take 1 tablet by mouth daily as needed for pain. 08/17/16   [provider]    Allergies    Trazodone and nefazodone  Review of Systems   Review of Systems  All other systems reviewed and are negative.   Physical Exam Updated Vital Signs BP 122/74   Pulse 95   Temp 98.3 F (36.8 C) (Oral)   Resp 16   Ht 5\' 6"  (1.676 m)   Wt 131 kg   LMP 05/25/2020   SpO2 99%   BMI 46.61 kg/m   Physical Exam Vitals and nursing note reviewed. Exam conducted with a chaperone present.  Constitutional:      General: She is not in acute distress.    Appearance: She is not toxic-appearing.  HENT:     Head: Normocephalic and atraumatic.     Mouth/Throat:     Pharynx: Oropharynx is clear. No oropharyngeal exudate.     Comments: Patent oropharynx. No trismus. Eyes:     General: No scleral icterus.    Extraocular Movements: Extraocular movements intact.     Conjunctiva/sclera: Conjunctivae normal.     Pupils: Pupils are equal, round, and reactive to light.     Comments: No nystagmus. No significant photosensitivity.  Neck:     Comments: Enlarged thyroid gland that is mildly tender to palpation. Cardiovascular:     Rate and Rhythm: Normal rate and regular rhythm.     Pulses: Normal pulses.     Comments: No carotid bruits. Pulmonary:     Effort: Pulmonary effort is normal. No respiratory distress.     Breath sounds: No wheezing or rales.     Comments: No stridor or wheezing. Musculoskeletal:        General: Normal range of motion.     Cervical back: Normal  range of motion. Tenderness present. No rigidity.     Comments: Patient able to move all extremities with strength intact against resistance.  Skin:    General: Skin is dry.  Neurological:     General: No focal deficit present.     Mental Status: She is alert and oriented to person, place, and time.     GCS: GCS eye subscore is 4. GCS verbal subscore is 5. GCS motor subscore is 6.     Cranial Nerves: No cranial nerve deficit.     Sensory: No sensory deficit.     Motor: No weakness.     Coordination: Coordination normal.     Gait: Gait normal.  Comments: CN II through XII grossly intact. Sensation intact and symmetric throughout. Moving all extremities. No obvious focal deficits.  Psychiatric:        Mood and Affect: Mood normal.        Behavior: Behavior normal.        Thought Content: Thought content normal.     ED Results / Procedures / Treatments   Labs (all labs ordered are listed, but only abnormal results are displayed) Labs Reviewed  CBC - Abnormal; Notable for the following components:      Result Value   WBC 3.4 (*)    RBC 3.14 (*)    Hemoglobin 10.8 (*)    HCT 29.9 (*)    MCH 34.4 (*)    MCHC 36.1 (*)    All other components within normal limits  COMPREHENSIVE METABOLIC PANEL - Abnormal; Notable for the following components:   Glucose, Bld 144 (*)    Calcium 8.2 (*)    Albumin 3.1 (*)    All other components within normal limits  I-STAT BETA HCG BLOOD, ED ([facility], [facility], [facility] ONLY)    EKG None  Radiology CT Head Wo Contrast  Result Date: 06/20/2020 CLINICAL DATA:  Headache EXAM: CT HEAD WITHOUT CONTRAST TECHNIQUE: Contiguous axial images were obtained from the base of the skull through the vertex without intravenous contrast. COMPARISON:  None. FINDINGS: Brain: There is no mass, hemorrhage or extra-axial collection. The size and configuration of the ventricles and extra-axial CSF spaces are normal. The brain parenchyma is normal, without acute or chronic  infarction. Vascular: No abnormal hyperdensity of the major intracranial arteries or dural venous sinuses. No intracranial atherosclerosis. Skull: The visualized skull base, calvarium and extracranial soft tissues are normal. Sinuses/Orbits: No fluid levels or advanced mucosal thickening of the visualized paranasal sinuses. No mastoid or middle ear effusion. The orbits are normal. IMPRESSION: Normal head CT. Electronically Signed   By: Ulyses Jarred M.D.   On: 06/20/2020 01:08    Procedures Procedures   Medications Ordered in ED Medications  prochlorperazine (COMPAZINE) injection 10 mg (10 mg Intravenous Given 06/20/20 0201)  sodium chloride 0.9 % bolus 1,000 mL (1,000 mLs Intravenous New Bag/Given 06/20/20 0202)  diphenhydrAMINE (BENADRYL) injection 25 mg (25 mg Intravenous Given 06/20/20 0209)  dexamethasone (DECADRON) injection 10 mg (10 mg Intravenous Given 06/20/20 0200)    ED Course  I have reviewed the triage vital signs and the nursing notes.  Pertinent labs & imaging results that were available during my care of the patient were reviewed by me and considered in my medical decision making (see chart for details).    MDM Rules/Calculators/A&P                          Latasha Brown was evaluated in Emergency Department on 06/20/2020 for the symptoms described in the history of present illness. She was evaluated in the context of the global COVID-19 pandemic, which necessitated consideration that the patient might be at risk for infection with the SARS-CoV-2 virus that causes COVID-19. Institutional protocols and algorithms that pertain to the evaluation of patients at risk for COVID-19 are in a state of rapid change based on information released by regulatory bodies including the CDC and federal and state organizations. These policies and algorithms were followed during the patient's care in the ED.  I personally reviewed patient's medical chart and all notes from triage and staff  during today's encounter. I have also ordered and  reviewed all labs and imaging that I felt to be medically necessary in the evaluation of this patient's complaints and with consideration of their physical exam. If needed, translation services were available and utilized.   Patient with headache symptoms and globus sensation. She has a history of migraine disorder. However, given that she recently was started on Eliquis and describes this as the worsening of her life, will obtain CT head without contrast. We are inside 6 hours since onset of her severe headache symptoms. No focal deficits on my examination. We will treat with IV Compazine, Benadryl, and IV NS. We will also provide her with a dose of Decadron for her globus sensation/sore throat in the context of her enlarged thyroid gland.  She is afebrile and vital signs are entirely within normal limits. This is not a thyroid storm. Do not need to repeat TSH that was obtained this morning.  Hypersensitivity reaction would have likely occurred within minutes of chemotherapy administration. Clinically she is in no acute distress. Physical exam is largely reassuring. No focal deficits. If her symptoms improve and her CT head without contrast is without acute intracranial bleed, feel as though she is reasonable for discharge and outpatient follow-up.  CT head without any acute intracranial abnormalities.  Laboratory work-up demonstrating a mild anemia when compared to labs obtained a couple weeks ago, hemoglobin of 10.8.  She is hemodynamically stable.  Leukopenia to be expected in the context of chemotherapy.  CMP unremarkable.  On reexamination, patient feels as though her headache symptoms have completely abated.  She is feeling much improved and is prepared for discharge.  Her mother is at bedside.  She is ambulatory and without ataxia.  She will follow-up with her oncologist tomorrow.  ED return precautions discussed.  Patient voices understanding and  is agreeable to the plan.  Final Clinical Impression(s) / ED Diagnoses Final diagnoses:  Acute nonintractable headache, unspecified headache type  Thyromegaly    Rx / DC Orders ED Discharge Orders    None       Corena Herter, PA-C 06/20/20 0301    Ripley Fraise, MD 06/20/20 [phone]

## 2020-06-20 NOTE — ED Notes (Signed)
Patient transported to CT 

## 2020-06-20 NOTE — Discharge Instructions (Addendum)
Please follow-up with your primary care provider and oncologist regarding today's ED encounter.  Continue with your at home Imitrex as needed for headache relief.  Please also continue with your already prescribed medications for thyroid discomfort.  Return to the ED or seek immediate medical attention should you experience any new or worsening symptoms.

## 2020-11-19 HISTORY — PX: MASTECTOMY: SHX3

## 2020-12-01 IMAGING — US US PELVIS COMPLETE TRANSABD/TRANSVAG W DUPLEX
2 series · 13 of 25 positions shown · non-contrast
Comparison: None.

CLINICAL DATA: Patient right-sided pelvic pain.

EXAM:
TRANSABDOMINAL AND TRANSVAGINAL ULTRASOUND OF PELVIS
DOPPLER ULTRASOUND OF OVARIES
TECHNIQUE: Both transabdominal and transvaginal ultrasound examinations of the
pelvis were performed. Transabdominal technique was performed for
global imaging of the pelvis including uterus, ovaries, adnexal
regions, and pelvic cul-de-sac.
It was necessary to proceed with endovaginal exam following the
transabdominal exam to visualize the adnexal structure. Color and
duplex Doppler ultrasound was utilized to evaluate blood flow to the
ovaries.

[Series 1: us pelvis complete transabd/transvag w duplex · 12 of 111 slices shown (1 of 2)]
[im 1/111]
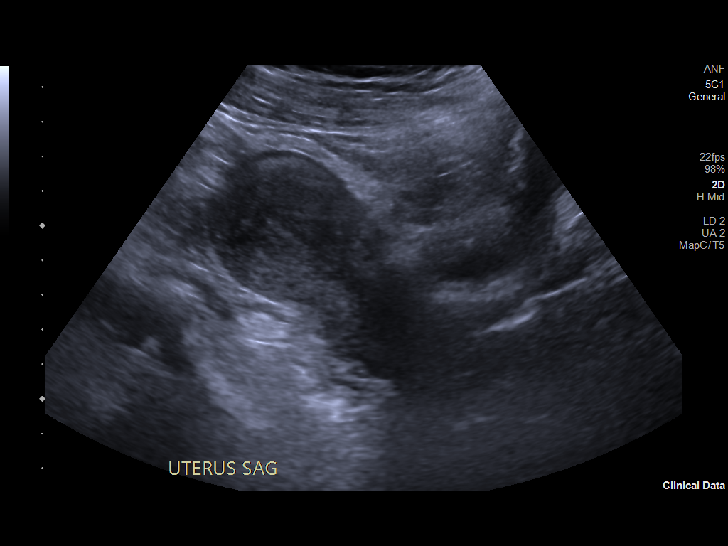
[im 10/111]
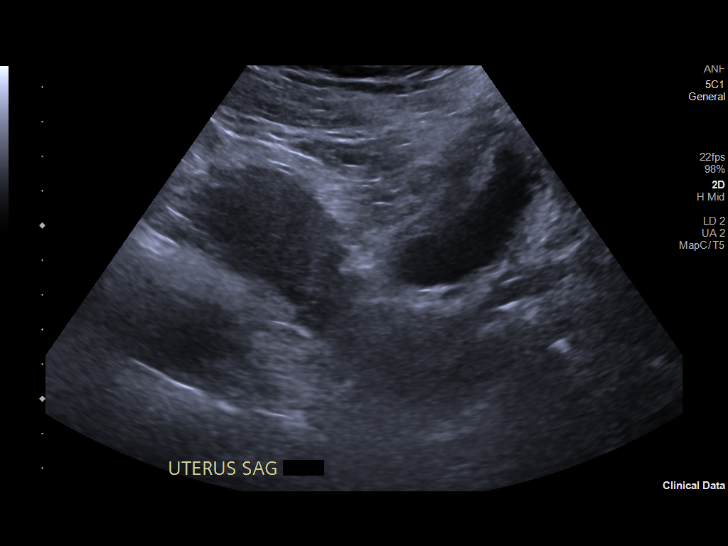
[im 20/111]
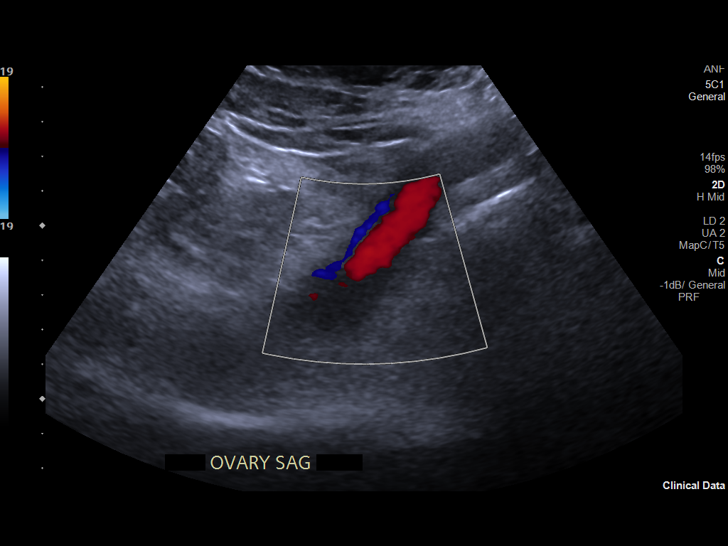
[im 29/111]
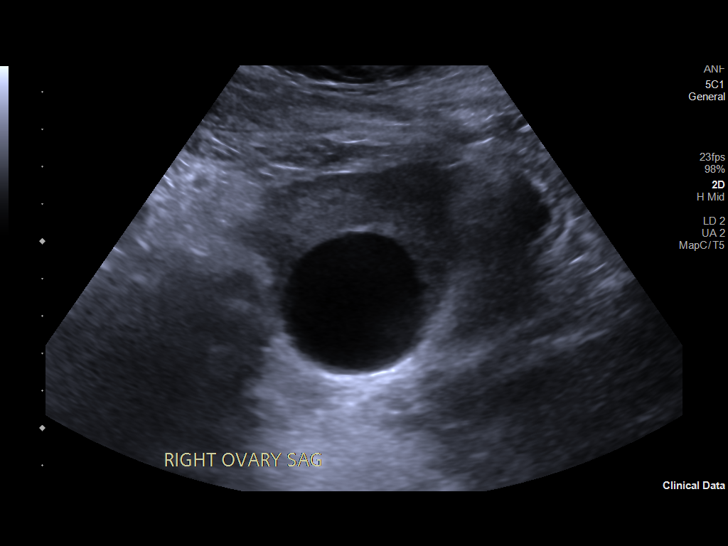
[im 39/111]
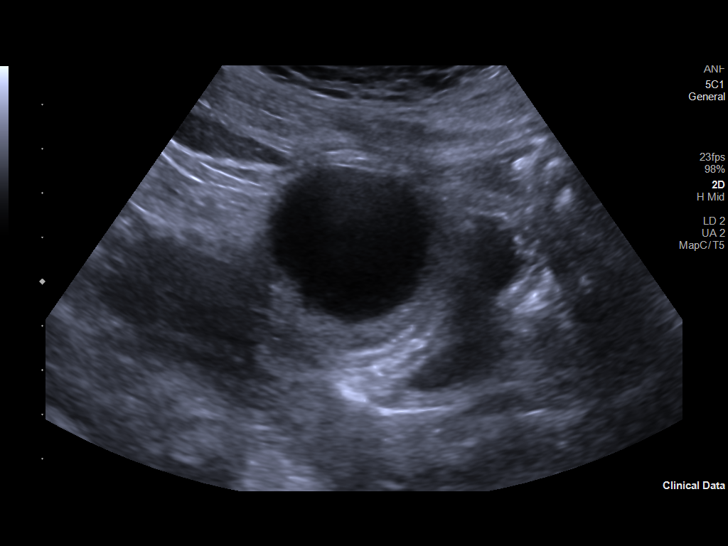
[im 48/111]
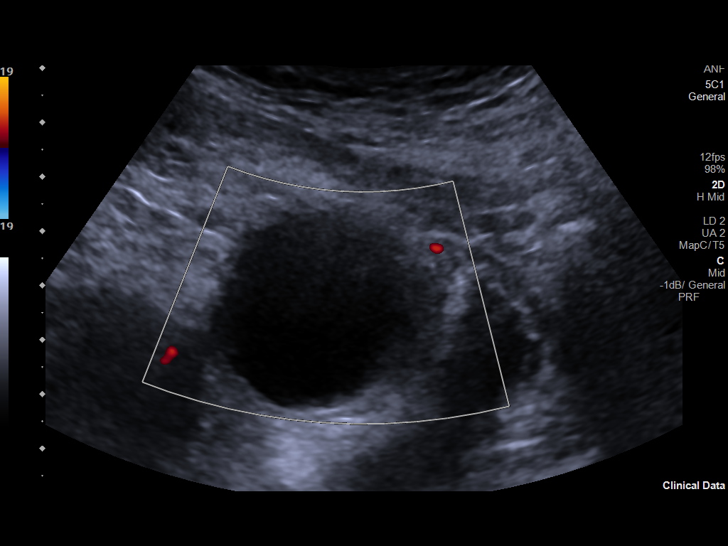
[im 58/111]
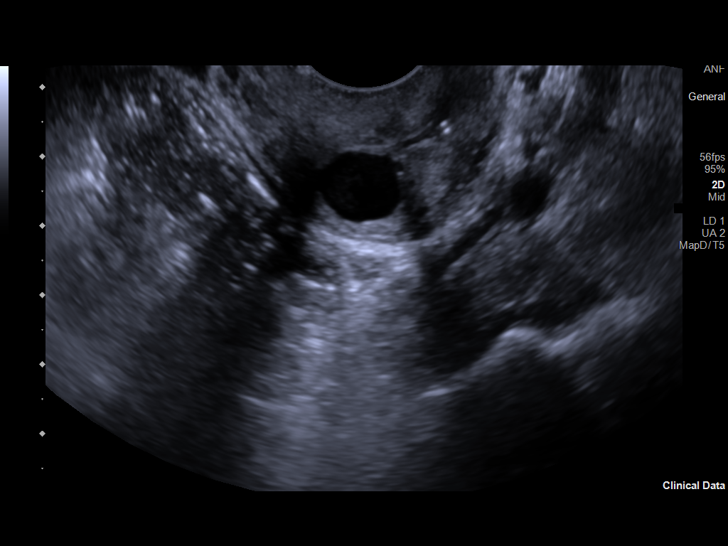
[im 67/111]
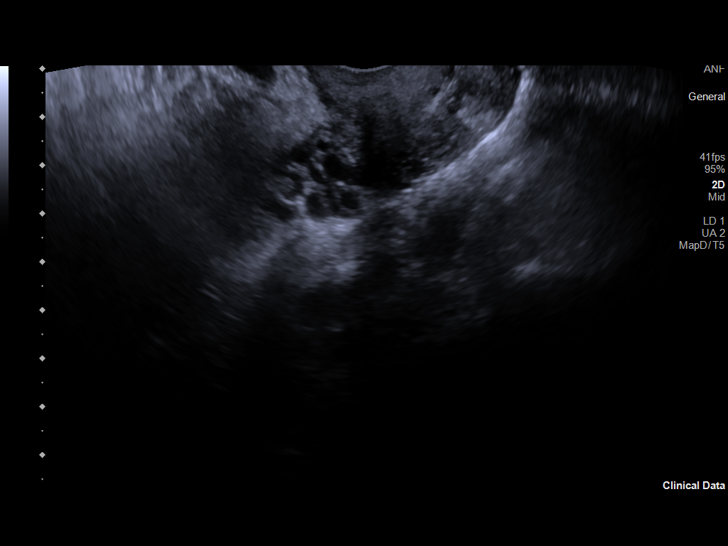
[im 77/111]
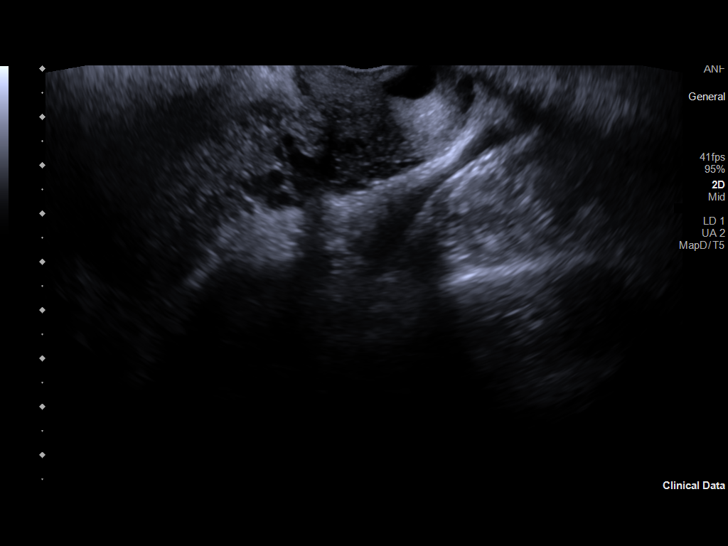
[im 87/111]
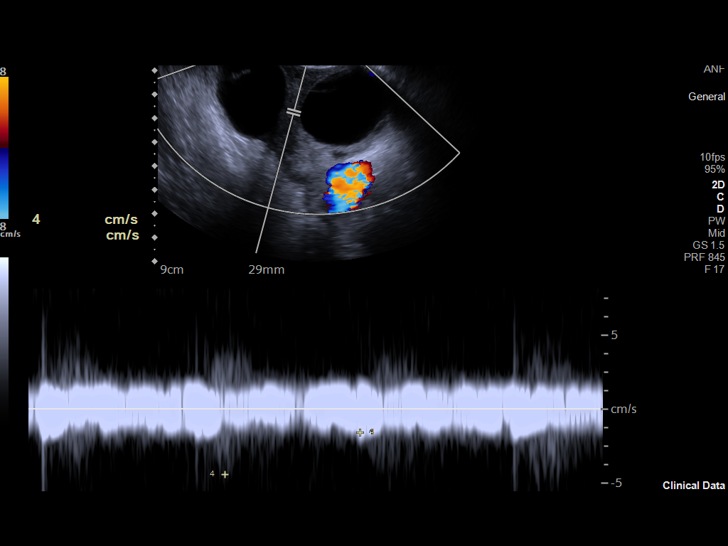
[im 96/111]
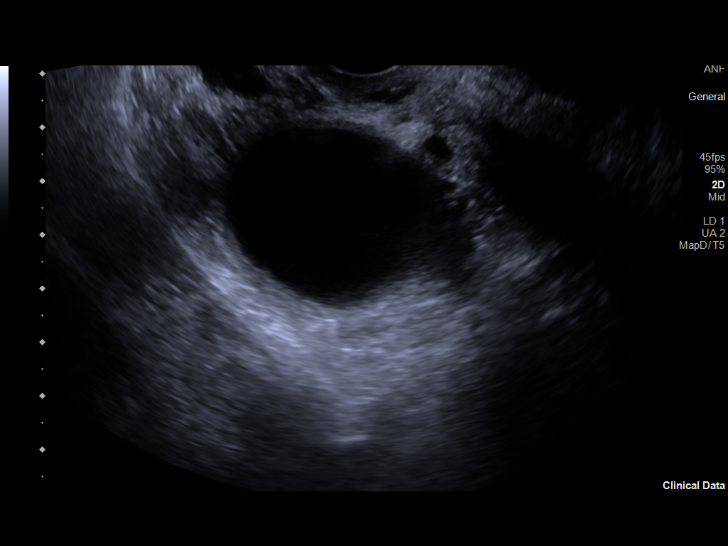
[im 106/111]
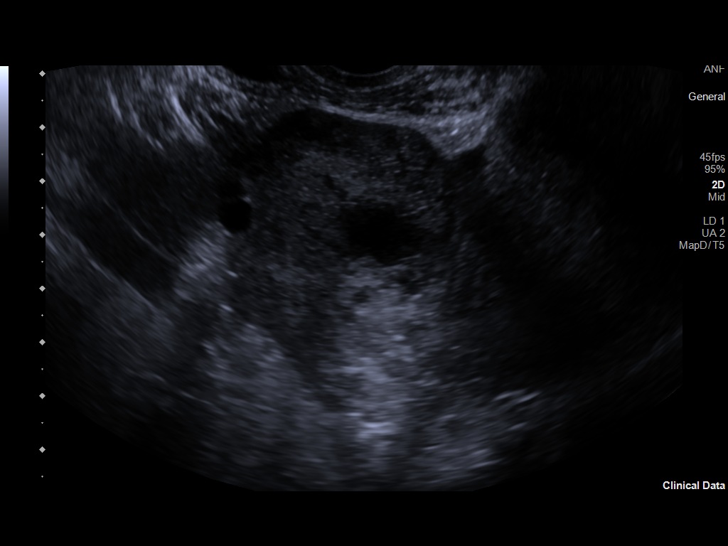

[Series 2: us pelvis complete transabd/transvag w duplex · 1 of 2 slices shown (2 of 2)]
[im 1/2]
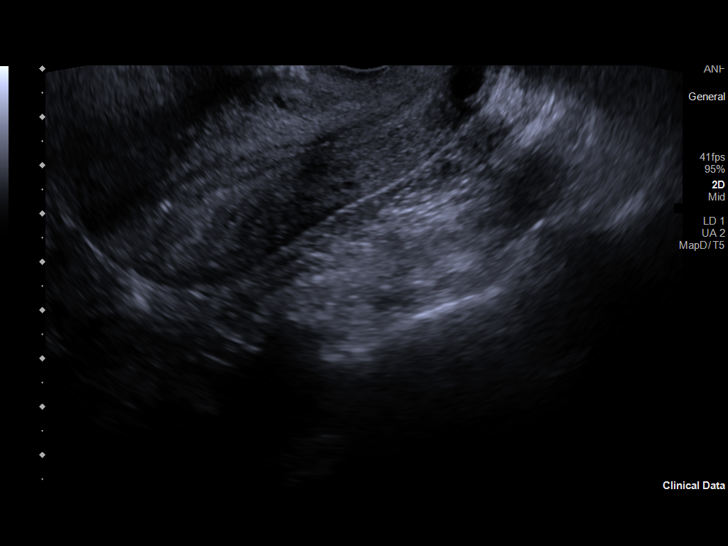

[13 of 25 positions shown; findings below may reference images not displayed]

FINDINGS: Uterus

Measurements: 9.7 x 3.9 x 4.2 cm = volume: 82.5 mL. No fibroids or
other mass visualized.

Endometrium

Thickness: 6 mm.  No focal abnormality visualized.

Right ovary

Measurements: 7.6 x 6.7 x 4.5 cm = volume: 120.3 mL. Within the
right ovary there is a 4.2 x 3.1 x 4.0 cm mildly complicated cyst
and a 3.8 x 3.8 x 4.0 cm mildly complicated cyst.

Left ovary

Surgically absent

Pulsed Doppler evaluation of the right ovary demonstrates normal
low-resistance arterial and venous waveforms.

Other findings

Trace fluid in the pelvis.
IMPRESSION: The right ovary is enlarged measuring up to 7 cm and contains 2
large mildly complicated cysts. Arterial and venous flow is
demonstrated within the right ovary on current examination. Given
the size of the ovary and associated cysts, intermittent torsion as
a cause of pain is not entirely excluded. Recommend follow-up pelvic
ultrasound in 6-8 weeks to assess for interval
improvement/resolution of large cysts within the right ovary.

## 2020-12-05 ENCOUNTER — Inpatient Hospital Stay
Admission: RE | Admit: 2020-12-05 | Discharge: 2020-12-05 | Disposition: A | Discharge status: Home / Self Care | Payer: Self-pay | Source: Ambulatory Visit | Attending: Radiation Oncology | Admitting: Radiation Oncology

## 2020-12-05 ENCOUNTER — Ambulatory Visit
Admission: RE | Admit: 2020-12-05 | Discharge: 2020-12-05 | Disposition: A | Discharge status: Home / Self Care | Payer: Self-pay | Source: Ambulatory Visit | Attending: Radiation Oncology | Admitting: Radiation Oncology

## 2020-12-05 ENCOUNTER — Other Ambulatory Visit: Payer: Self-pay | Admitting: Radiation Oncology

## 2020-12-05 DIAGNOSIS — C50412 Malignant neoplasm of upper-outer quadrant of left female breast: Secondary | ICD-10-CM

## 2020-12-11 ENCOUNTER — Ambulatory Visit: Admission: RE | Admit: 2020-12-11 | Payer: Medicaid Other | Source: Ambulatory Visit | Admitting: Radiation Oncology

## 2020-12-11 ENCOUNTER — Telehealth: Payer: Self-pay | Admitting: Radiation Oncology

## 2020-12-11 DIAGNOSIS — Z171 Estrogen receptor negative status [ER-]: Secondary | ICD-10-CM | POA: Insufficient documentation

## 2020-12-11 DIAGNOSIS — C50812 Malignant neoplasm of overlapping sites of left female breast: Secondary | ICD-10-CM | POA: Insufficient documentation

## 2020-12-12 ENCOUNTER — Other Ambulatory Visit: Payer: Self-pay

## 2020-12-12 ENCOUNTER — Encounter (HOSPITAL_COMMUNITY): Payer: Self-pay | Admitting: Emergency Medicine

## 2020-12-12 ENCOUNTER — Emergency Department (HOSPITAL_COMMUNITY): Payer: Medicaid Other

## 2020-12-12 ENCOUNTER — Emergency Department (HOSPITAL_COMMUNITY)
Admission: EM | Admit: 2020-12-12 | Discharge: 2020-12-12 | Disposition: A | Discharge status: Home / Self Care | Payer: Medicaid Other | Attending: Emergency Medicine | Admitting: Emergency Medicine

## 2020-12-12 DIAGNOSIS — G8918 Other acute postprocedural pain: Secondary | ICD-10-CM | POA: Diagnosis present

## 2020-12-12 DIAGNOSIS — R079 Chest pain, unspecified: Secondary | ICD-10-CM | POA: Insufficient documentation

## 2020-12-12 DIAGNOSIS — Z5321 Procedure and treatment not carried out due to patient leaving prior to being seen by health care provider: Secondary | ICD-10-CM | POA: Diagnosis not present

## 2020-12-12 HISTORY — DX: Malignant (primary) neoplasm, unspecified: C80.1

## 2020-12-12 NOTE — ED Notes (Signed)
Pt not responding for vital recheck

## 2020-12-12 NOTE — ED Notes (Signed)
Pt called outside multiple times no answer

## 2020-12-12 NOTE — ED Triage Notes (Signed)
Pt reports having a left mastectomy x 2 weeks ago. States she lifted something over the weekend and has been having continued pain that is not relieved with her prescribed meds. Also states she has had an increase in drain output.

## 2020-12-12 NOTE — ED Provider Notes (Signed)
Emergency Medicine Provider Triage Evaluation Note  Latasha Brown , a 40 y.o. female  was evaluated in triage.  Pt complains of left-sided chest pain incision site.  Patient reports that pain started over this weekend after she lifted something.  Pain is worse with movement.  Minimal relief with ibuprofen.  Patient reports that she had left mastectomy 2 weeks prior.  Patient reports increase in drain output.  Denies any fevers, chills, purulent discharge, shortness of breath, diaphoresis, abdominal pain, nausea, vomiting  Review of Systems  Positive: Myalgia Negative: vers, chills, purulent discharge, shortness of breath, diaphoresis, abdominal pain, nausea, vomiting  Physical Exam  BP 118/64 (BP Location: Right Arm)   Pulse 68   Temp 99.3 F (37.4 C) (Oral)   Resp 18   SpO2 100%  Gen:   Awake, no distress   Resp:  Normal effort  MSK:   Moves extremities without difficulty  Other:  Drain noted to have mild serosanguineous fluid, incision site clean, dry, and intact with no purulent discharge or erythema.  Tenderness along incision site.  Chaperone present  Medical Decision Making  Medically screening exam initiated at 3:57 PM.  Appropriate orders placed.  Cherlyn Labella was informed that the remainder of the evaluation will be completed by another provider, this initial triage assessment does not replace that evaluation, and the importance of remaining in the ED until their evaluation is complete.  The patient appears stable so that the remainder of the work up may be completed by another provider.      Loni Beckwith, PA-C 12/12/20 1559    Truddie Hidden, MD 12/13/20 [phone]

## 2020-12-16 NOTE — Progress Notes (Signed)
New Breast Cancer Diagnosis: Left Breast Cancer UOQ  Did patient present with symptoms (if so, please note symptoms) or screening mammography?:Screening Mass    Patient noted lump in her left breast around August 2021.  Denies nipple distortion or drainage.  Location and Extent of disease :left breast. Located at 12 o'clock position, measured  2.8 x 1.2 x 1.4 cm.  Calcifications in the UOQ spanning 10 cm, several enlarged LN with normal morphology and cortex.    Histology per Pathology Report: grade 2, Invasive Ductal Carcinoma  Receptor Status: ER(negative), PR (negative), Her2-neu (negative), Ki-(%)  Surgeon and surgical plan, if any:  Dr. Stasia Cavalier -Left Breast Mastectomy and SLN biopsy 11/19/2020 -4 mm residual disease Seen by Surgeon today. Drains removed today.     Medical oncologist, treatment if any:   Dr. Rip Harbour -Chemotherapy: 1/7-3/23/2022 neoadjuvant pembro/carbo/taxol; 08/14/2020-10/16/2020 pembro/ddAC; Pembrolizumab along started 11/06/2020.  -Surgery was 11/19/2020 and found 4 mm of residual disease. -Following adjuvant RT, we will proceed with 6-8 cycles of adjuvant Xeloda. -The patient would like to undergo RT at [facility] in [place].    Family History of Breast/Ovarian/Prostate Cancer: None  Lymphedema issues, if any: No      Pain issues, if any: none    SAFETY ISSUES: Prior radiation? No Pacemaker/ICD? No Possible current pregnancy? Bilateral Salpingectomy, Left Ovary removed. Is the patient on methotrexate? No  Current Complaints / other details:   -Genetic Testing: 07/30/2020, negative.

## 2020-12-18 ENCOUNTER — Encounter: Payer: Self-pay | Admitting: Radiation Oncology

## 2020-12-18 ENCOUNTER — Ambulatory Visit
Admission: RE | Admit: 2020-12-18 | Discharge: 2020-12-18 | Disposition: A | Discharge status: Home / Self Care | Payer: Medicaid Other | Source: Ambulatory Visit | Attending: Radiation Oncology | Admitting: Radiation Oncology

## 2020-12-18 VITALS — Ht 66.0 in | Wt 298.0 lb

## 2020-12-18 DIAGNOSIS — Z171 Estrogen receptor negative status [ER-]: Secondary | ICD-10-CM

## 2020-12-18 DIAGNOSIS — C50812 Malignant neoplasm of overlapping sites of left female breast: Secondary | ICD-10-CM

## 2020-12-18 HISTORY — DX: Other pulmonary embolism without acute cor pulmonale: I26.99

## 2020-12-18 HISTORY — DX: Thyrotoxicosis, unspecified without thyrotoxic crisis or storm: E05.90

## 2020-12-18 HISTORY — DX: Malignant neoplasm of unspecified site of unspecified female breast: C50.919

## 2020-12-20 NOTE — Progress Notes (Signed)
Radiation Oncology         ([phone]) [phone] ________________________________  Name: Latasha Brown        MRN: [id number]  Date of Service: 12/18/2020 DOB: 1980/12/14  [id number], Maximino Sarin, MD  Kimmick, Foye Deer*     REFERRING PHYSICIAN: Antonieta Pert*   DIAGNOSIS: The encounter diagnosis was Malignant neoplasm of overlapping sites of left breast in female, estrogen receptor negative ([place]).   HISTORY OF PRESENT ILLNESS: Latasha Brown is a 40 y.o. female with a diagnosis of left breast cancer. The patient was noted to have a palpable mass in the left breast and diagnostic imaging at [facility] on 04/04/2020 revealed microcalcifications in the upper outer left breast spanning 10 cm.  Further imaging of the right breast also showed a mass in the lower outer breast middle depth.  Diagnostic ultrasound bilaterally that day also demonstrated the mass in the right breast measuring 1.3 cm.  Her axilla was negative for adenopathy.  The left breast lesion seen on mammography was located in the 12 o'clock position 5 cm from the nipple demonstrating a 2.8 cm irregular mass with indistinct and angular margins and features consistent with calcifications and these findings were noted to be extensive consistent with her mammographic finding.  She underwent biopsy on 04/05/2020 the right breast mass revealed fibroadenoma without any evidence of atypia.  The left breast however showed a grade 3 invasive ductal carcinoma with associated high-grade DCIS and calcifications, lymphovascular space invasion was not identified.  Her tumor was ER/PR negative HER2 negative by FISH.  Pet imaging on 04/30/2020 showed hypermetabolism within the left breast with an SUV maximum of 8.1, measurements of the abnormality were not given, her axillary lymph nodes were not hypermetabolic.  Given her findings she underwent neoadjuvant chemotherapy between 05/10/2020 and 07/24/2020, changes were made to her regimen in  April and she completed her dose dense chemotherapy with pembrolizumab on 10/16/2020 and has continued single agent pembrolizumab and consolidation beginning on 11/06/2020.  She proceeded with left mastectomy and sentinel lymph node biopsy on 11/19/2020.  Final pathology revealed grade 2 invasive ductal carcinoma measuring 4 mm in greatest dimension, associated high-grade DCIS with calcifications were seen measuring up to 2.3 cm.  Lymphovascular space invasion was negative.  Her margins were clear for invasive and in situ disease.  5 sampled lymph nodes were evaluated as well and none contained metastatic disease.  Repeat prognostic panel again confirmed triple negative disease.  She is seen today to discuss possible role of adjuvant radiotherapy.     PREVIOUS RADIATION THERAPY: No   PAST MEDICAL HISTORY:  Past Medical History:  Diagnosis Date   Anxiety    Asthma    Breast cancer ([place])    Cancer ([place])    Fibromyalgia    Hyperthyroidism    Migraine    Pulmonary embolism ([place])        PAST SURGICAL HISTORY: Past Surgical History:  Procedure Laterality Date   CHOLECYSTECTOMY     KNEE SURGERY     MASTECTOMY Left 11/19/2020   OVARY SURGERY     TONSILLECTOMY     TOOTH EXTRACTION       FAMILY HISTORY: History reviewed. No pertinent family history.   SOCIAL HISTORY:  reports that she has never smoked. She has never used smokeless tobacco. She reports current alcohol use. She reports that she does not currently use drugs after having used the following drugs: Marijuana.   ALLERGIES: Trazodone and nefazodone   MEDICATIONS:  Current Outpatient Medications  Medication Sig Dispense Refill   acetaminophen (TYLENOL) 325 MG tablet Take 650 mg by mouth every 6 (six) hours as needed.     albuterol (VENTOLIN HFA) 108 (90 Base) MCG/ACT inhaler Inhale 2 puffs into the lungs daily as needed for shortness of breath.     ibuprofen (ADVIL) 200 MG tablet Take 600 mg by mouth every 6 (six) hours as  needed for moderate pain.     ibuprofen (ADVIL) 600 MG tablet Take 600 mg by mouth every 6 (six) hours as needed.     levothyroxine (SYNTHROID) 175 MCG tablet Take by mouth.     LORazepam (ATIVAN) 0.5 MG tablet Take by mouth.     ondansetron (ZOFRAN ODT) 4 MG disintegrating tablet Take 1 tablet (4 mg total) by mouth every 8 (eight) hours as needed for nausea or vomiting. 10 tablet 0   oxybutynin (DITROPAN) 5 MG tablet Take 5 mg by mouth 3 (three) times daily.     traMADol (ULTRAM) 50 MG tablet Take 1 tablet by mouth daily as needed for pain.     traMADol (ULTRAM) 50 MG tablet Take by mouth.     citalopram (CELEXA) 40 MG tablet Take 1 tablet by mouth daily.     norethindrone (MICRONOR) 0.35 MG tablet Take 1 tablet by mouth daily.     SUMAtriptan (IMITREX) 50 MG tablet Take 1 tablet (50 mg total) by mouth daily as needed for migraine. (Patient not taking: Reported on 12/18/2020) 10 tablet 0   topiramate (TOPAMAX) 25 MG tablet Take 1 tablet by mouth daily.     No current facility-administered medications for this encounter.     REVIEW OF SYSTEMS: On review of systems, the patient reports that she is doing very well, she just had her drains removed at [facility].  She states that she has been feeling pretty well overall.  She is planning to delay reconstruction, and is not quite sure which modality she will proceed with.  She has been very busy with her family, and is happy to be done with the phase of chemotherapy she continues to receive Keytruda and seems to be tolerating this well.  No other complaints are specifically verbalized.    PHYSICAL EXAM:  Wt Readings from Last 3 Encounters:  12/18/20 298 lb (135.2 kg)  06/19/20 288 lb 12.8 oz (131 kg)  06/08/20 288 lb 12.8 oz (131 kg)   Temp Readings from Last 3 Encounters:  12/12/20 99.3 F (37.4 C) (Oral)  06/19/20 98.3 F (36.8 C) (Oral)  06/08/20 99.4 F (37.4 C) (Oral)   BP Readings from Last 3 Encounters:  12/12/20 118/64  06/20/20  132/76  06/08/20 (!) 139/116   Pulse Readings from Last 3 Encounters:  12/12/20 68  06/20/20 96  06/08/20 95    In general this is a well appearing African American female in no acute distress. She's alert and oriented x4 and appropriate throughout the examination. Cardiopulmonary assessment is negative for acute distress and she exhibits normal effort. Bilateral breast exam is deferred.    ECOG = 1  0 - Asymptomatic (Fully active, able to carry on all predisease activities without restriction)  1 - Symptomatic but completely ambulatory (Restricted in physically strenuous activity but ambulatory and able to carry out work of a light or sedentary nature. For example, light housework, office work)  2 - Symptomatic, <50% in bed during the day (Ambulatory and capable of all self care but unable to carry out any work activities.  Up and about more than 50% of waking hours)  3 - Symptomatic, >50% in bed, but not bedbound (Capable of only limited self-care, confined to bed or chair 50% or more of waking hours)  4 - Bedbound (Completely disabled. Cannot carry on any self-care. Totally confined to bed or chair)  5 - Death   Eustace Pen MM, Creech RH, Tormey DC, et al. [phone]). "Toxicity and response criteria of the [facility]". [place] Oncol. 5 (6): 649-55    LABORATORY DATA:  Lab Results  Component Value Date   WBC 3.4 (L) 06/20/2020   HGB 10.8 (L) 06/20/2020   HCT 29.9 (L) 06/20/2020   MCV 95.2 06/20/2020   PLT 205 06/20/2020   Lab Results  Component Value Date   NA 137 06/20/2020   K 4.3 06/20/2020   CL 104 06/20/2020   CO2 22 06/20/2020   Lab Results  Component Value Date   ALT 37 06/20/2020   AST 36 06/20/2020   ALKPHOS 38 06/20/2020   BILITOT 0.7 06/20/2020      RADIOGRAPHY: DG Chest 1 View  Result Date: 12/12/2020 CLINICAL DATA:  Left-sided chest pain at incision site EXAM: CHEST  1 VIEW COMPARISON:  06/08/2020 FINDINGS: The heart size and  mediastinal contours are within normal limits. Right chest port catheter. Both lungs are clear. The visualized skeletal structures are unremarkable. Surgical clips about the left chest and axilla. IMPRESSION: 1.  No acute abnormality of the lungs. 2.  Surgical clips about the left chest and axilla. Electronically Signed   By: Eddie Candle M.D.   On: 12/12/2020 16:46       IMPRESSION/PLAN: 1.         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 Dr. Lisbeth Renshaw discusses the pathology findings and reviews the nature of triple negative breast disease.  We discussed the patient's course, her original diagnosis clinically her response to treatment and reviewed NCCN guidelines for treatment.  While her tumor does not matchup to the typical guidelines for treating patients with postmastectomy radiotherapy, and the 2020 NCCN guidelines for discussion points in the manuscript section discussing the patients who have high risk features for local recurrence, postmastectomy radiotherapy should be considered to reduce risks of local recurrence.  Given her young age as well, Dr. Lisbeth Renshaw offers this therapy to the patient.  We discussed the risks, benefits, short, and long term effects of radiotherapy, as well as the curative intent, and the patient is interested in proceeding. Dr. Lisbeth Renshaw discusses the delivery and logistics of radiotherapy and anticipates a course of 6 1/2 weeks of radiotherapy.  We will ask our simulation team to reach out to the patient coordinate simulation on 12/27/20 and she will sign consent to proceed at that time. 2.         Contraceptive Counseling. The patient is aware of the risks of pregancy during radiotherapy and will perform a home pregnancy test the day prior or day of her simulation and report her results to our treatment staff when she comes for simulation.   This encounter was provided by  telemedicine platform MyChart though connectivity converted this visit to telephone.  The patient has provided two factor identification and has given verbal consent for this type of encounter and has been advised to only accept a meeting of this type in a secure network environment. The time spent during this encounter was 60 minutes including  preparation, discussion, and coordination of the patient's care. The attendants for this meeting include Blenda Nicely, RN, Dr. Lisbeth Renshaw, Hayden Pedro  and Cherlyn Labella.  During the encounter,  Blenda Nicely, RN, Dr. Lisbeth Renshaw, and Hayden Pedro were located at [facility] Radiation Oncology Department.  Cherlyn Labella was located at home.    The above documentation reflects my direct findings during this shared patient visit. Please see the separate note by Dr. Lisbeth Renshaw on this date for the remainder of the patient's plan of care.    Carola Rhine, [facility]    **Disclaimer: This note was dictated with voice recognition software. Similar sounding words can inadvertently be transcribed and this note may contain transcription errors which may not have been corrected upon publication of note.**

## 2021-01-03 ENCOUNTER — Ambulatory Visit: Payer: Medicaid Other | Admitting: Radiation Oncology

## 2021-01-03 DIAGNOSIS — Z510 Encounter for antineoplastic radiation therapy: Secondary | ICD-10-CM | POA: Insufficient documentation

## 2021-01-03 DIAGNOSIS — C50812 Malignant neoplasm of overlapping sites of left female breast: Secondary | ICD-10-CM | POA: Insufficient documentation

## 2021-01-03 DIAGNOSIS — Z171 Estrogen receptor negative status [ER-]: Secondary | ICD-10-CM | POA: Insufficient documentation

## 2021-01-13 ENCOUNTER — Ambulatory Visit: Payer: Medicaid Other | Admitting: Radiation Oncology

## 2021-01-14 ENCOUNTER — Ambulatory Visit
Admission: RE | Admit: 2021-01-14 | Discharge: 2021-01-14 | Disposition: A | Discharge status: Home / Self Care | Payer: Medicaid Other | Source: Ambulatory Visit | Attending: Radiation Oncology | Admitting: Radiation Oncology

## 2021-01-14 ENCOUNTER — Ambulatory Visit: Payer: Medicaid Other

## 2021-01-14 ENCOUNTER — Other Ambulatory Visit: Payer: Self-pay

## 2021-01-14 ENCOUNTER — Encounter: Payer: Self-pay | Admitting: Radiation Oncology

## 2021-01-14 DIAGNOSIS — Z171 Estrogen receptor negative status [ER-]: Secondary | ICD-10-CM | POA: Diagnosis not present

## 2021-01-14 DIAGNOSIS — Z510 Encounter for antineoplastic radiation therapy: Secondary | ICD-10-CM | POA: Diagnosis not present

## 2021-01-14 DIAGNOSIS — C50812 Malignant neoplasm of overlapping sites of left female breast: Secondary | ICD-10-CM | POA: Diagnosis not present

## 2021-01-14 NOTE — Progress Notes (Addendum)
Confirmed pt no longer has had bilateral salpingectomies and no pregnancy testing is needed prior to proceeding with radiation treatment or planning.

## 2021-01-15 ENCOUNTER — Ambulatory Visit: Payer: Medicaid Other

## 2021-01-16 ENCOUNTER — Ambulatory Visit: Payer: Medicaid Other

## 2021-01-17 ENCOUNTER — Ambulatory Visit: Payer: Medicaid Other

## 2021-01-20 ENCOUNTER — Ambulatory Visit: Payer: Medicaid Other

## 2021-01-21 ENCOUNTER — Ambulatory Visit: Payer: Medicaid Other | Admitting: Radiation Oncology

## 2021-01-21 ENCOUNTER — Ambulatory Visit: Payer: Medicaid Other

## 2021-01-22 ENCOUNTER — Ambulatory Visit: Payer: Medicaid Other

## 2021-01-23 ENCOUNTER — Ambulatory Visit: Payer: Medicaid Other

## 2021-01-24 ENCOUNTER — Ambulatory Visit: Payer: Medicaid Other

## 2021-01-27 ENCOUNTER — Ambulatory Visit: Payer: Medicaid Other

## 2021-01-27 ENCOUNTER — Ambulatory Visit: Admission: RE | Admit: 2021-01-27 | Payer: Medicaid Other | Source: Ambulatory Visit

## 2021-01-27 ENCOUNTER — Other Ambulatory Visit: Payer: Self-pay

## 2021-01-27 DIAGNOSIS — Z510 Encounter for antineoplastic radiation therapy: Secondary | ICD-10-CM | POA: Diagnosis not present

## 2021-01-28 ENCOUNTER — Ambulatory Visit: Payer: Medicaid Other

## 2021-01-28 ENCOUNTER — Ambulatory Visit
Admission: RE | Admit: 2021-01-28 | Discharge: 2021-01-28 | Disposition: A | Discharge status: Home / Self Care | Payer: Medicaid Other | Source: Ambulatory Visit | Attending: Radiation Oncology | Admitting: Radiation Oncology

## 2021-01-28 ENCOUNTER — Other Ambulatory Visit: Payer: Self-pay

## 2021-01-28 DIAGNOSIS — Z510 Encounter for antineoplastic radiation therapy: Secondary | ICD-10-CM | POA: Diagnosis not present

## 2021-01-29 ENCOUNTER — Ambulatory Visit: Payer: Medicaid Other

## 2021-01-29 ENCOUNTER — Ambulatory Visit
Admission: RE | Admit: 2021-01-29 | Discharge: 2021-01-29 | Disposition: A | Discharge status: Home / Self Care | Payer: Medicaid Other | Source: Ambulatory Visit | Attending: Radiation Oncology | Admitting: Radiation Oncology

## 2021-01-29 DIAGNOSIS — Z510 Encounter for antineoplastic radiation therapy: Secondary | ICD-10-CM | POA: Diagnosis not present

## 2021-01-30 ENCOUNTER — Ambulatory Visit: Payer: Medicaid Other

## 2021-01-30 ENCOUNTER — Other Ambulatory Visit: Payer: Self-pay

## 2021-01-30 ENCOUNTER — Ambulatory Visit
Admission: RE | Admit: 2021-01-30 | Discharge: 2021-01-30 | Disposition: A | Discharge status: Home / Self Care | Payer: Medicaid Other | Source: Ambulatory Visit | Attending: Radiation Oncology | Admitting: Radiation Oncology

## 2021-01-30 DIAGNOSIS — Z510 Encounter for antineoplastic radiation therapy: Secondary | ICD-10-CM | POA: Diagnosis not present

## 2021-01-30 NOTE — Progress Notes (Signed)
Pt here for patient teaching.  Pt given Radiation and You booklet, skin care instructions, Alra deodorant, and Radiaplex gel.  Reviewed areas of pertinence such as fatigue, hair loss, skin changes, breast tenderness, and breast swelling . Pt able to give teach back of to pat skin and use unscented/gentle soap,apply Radiaplex bid, avoid applying anything to skin within 4 hours of treatment, avoid wearing an under wire bra, and to use an electric razor if they must shave. Pt verbalizes understanding of information given and will contact nursing with any questions or concerns.     Http://rtanswers.org/treatmentinformation/whattoexpect/index  Zetta Stoneman M. Donzel Romack RN, BSN       

## 2021-01-31 ENCOUNTER — Ambulatory Visit
Admission: RE | Admit: 2021-01-31 | Discharge: 2021-01-31 | Disposition: A | Discharge status: Home / Self Care | Payer: Medicaid Other | Source: Ambulatory Visit | Attending: Radiation Oncology | Admitting: Radiation Oncology

## 2021-01-31 ENCOUNTER — Ambulatory Visit: Payer: Medicaid Other

## 2021-01-31 DIAGNOSIS — C50812 Malignant neoplasm of overlapping sites of left female breast: Secondary | ICD-10-CM

## 2021-01-31 DIAGNOSIS — Z171 Estrogen receptor negative status [ER-]: Secondary | ICD-10-CM

## 2021-01-31 DIAGNOSIS — Z510 Encounter for antineoplastic radiation therapy: Secondary | ICD-10-CM | POA: Diagnosis not present

## 2021-01-31 MED ORDER — RADIAPLEXRX EX GEL: Freq: Once | CUTANEOUS | Status: AC

## 2021-01-31 MED ORDER — ALRA NON-METALLIC DEODORANT (RAD-ONC)
1.0000 "application " | Freq: Once | TOPICAL | Status: AC
  Administered 2021-01-31: 1 via TOPICAL

## 2021-02-03 ENCOUNTER — Ambulatory Visit: Payer: Medicaid Other

## 2021-02-04 ENCOUNTER — Ambulatory Visit: Payer: Medicaid Other

## 2021-02-04 ENCOUNTER — Ambulatory Visit
Admission: RE | Admit: 2021-02-04 | Discharge: 2021-02-04 | Disposition: A | Discharge status: Home / Self Care | Payer: Medicaid Other | Source: Ambulatory Visit | Attending: Radiation Oncology | Admitting: Radiation Oncology

## 2021-02-04 ENCOUNTER — Other Ambulatory Visit: Payer: Self-pay

## 2021-02-04 DIAGNOSIS — C50812 Malignant neoplasm of overlapping sites of left female breast: Secondary | ICD-10-CM | POA: Diagnosis not present

## 2021-02-04 DIAGNOSIS — Z510 Encounter for antineoplastic radiation therapy: Secondary | ICD-10-CM | POA: Diagnosis not present

## 2021-02-04 DIAGNOSIS — Z171 Estrogen receptor negative status [ER-]: Secondary | ICD-10-CM | POA: Diagnosis not present

## 2021-02-05 ENCOUNTER — Ambulatory Visit
Admission: RE | Admit: 2021-02-05 | Discharge: 2021-02-05 | Disposition: A | Discharge status: Home / Self Care | Payer: Medicaid Other | Source: Ambulatory Visit | Attending: Radiation Oncology | Admitting: Radiation Oncology

## 2021-02-05 ENCOUNTER — Ambulatory Visit: Payer: Medicaid Other

## 2021-02-05 DIAGNOSIS — Z510 Encounter for antineoplastic radiation therapy: Secondary | ICD-10-CM | POA: Diagnosis not present

## 2021-02-06 ENCOUNTER — Ambulatory Visit
Admission: RE | Admit: 2021-02-06 | Discharge: 2021-02-06 | Disposition: A | Discharge status: Home / Self Care | Payer: Medicaid Other | Source: Ambulatory Visit | Attending: Radiation Oncology | Admitting: Radiation Oncology

## 2021-02-06 ENCOUNTER — Other Ambulatory Visit: Payer: Self-pay

## 2021-02-06 ENCOUNTER — Ambulatory Visit: Payer: Medicaid Other

## 2021-02-06 DIAGNOSIS — Z510 Encounter for antineoplastic radiation therapy: Secondary | ICD-10-CM | POA: Diagnosis not present

## 2021-02-07 ENCOUNTER — Ambulatory Visit: Payer: Medicaid Other | Admitting: Radiation Oncology

## 2021-02-07 ENCOUNTER — Ambulatory Visit
Admission: RE | Admit: 2021-02-07 | Discharge: 2021-02-07 | Disposition: A | Discharge status: Home / Self Care | Payer: Medicaid Other | Source: Ambulatory Visit | Attending: Radiation Oncology | Admitting: Radiation Oncology

## 2021-02-07 ENCOUNTER — Ambulatory Visit: Payer: Medicaid Other

## 2021-02-07 DIAGNOSIS — Z510 Encounter for antineoplastic radiation therapy: Secondary | ICD-10-CM | POA: Diagnosis not present

## 2021-02-10 ENCOUNTER — Ambulatory Visit: Admission: RE | Admit: 2021-02-10 | Payer: Medicaid Other | Source: Ambulatory Visit

## 2021-02-10 ENCOUNTER — Ambulatory Visit: Payer: Medicaid Other

## 2021-02-10 ENCOUNTER — Other Ambulatory Visit: Payer: Self-pay

## 2021-02-11 ENCOUNTER — Ambulatory Visit
Admission: RE | Admit: 2021-02-11 | Discharge: 2021-02-11 | Disposition: A | Discharge status: Home / Self Care | Payer: Medicaid Other | Source: Ambulatory Visit | Attending: Radiation Oncology | Admitting: Radiation Oncology

## 2021-02-11 ENCOUNTER — Ambulatory Visit: Payer: Medicaid Other

## 2021-02-11 DIAGNOSIS — Z510 Encounter for antineoplastic radiation therapy: Secondary | ICD-10-CM | POA: Diagnosis not present

## 2021-02-12 ENCOUNTER — Ambulatory Visit: Payer: Medicaid Other

## 2021-02-12 ENCOUNTER — Ambulatory Visit
Admission: RE | Admit: 2021-02-12 | Discharge: 2021-02-12 | Disposition: A | Discharge status: Home / Self Care | Payer: Medicaid Other | Source: Ambulatory Visit | Attending: Radiation Oncology | Admitting: Radiation Oncology

## 2021-02-12 DIAGNOSIS — Z510 Encounter for antineoplastic radiation therapy: Secondary | ICD-10-CM | POA: Diagnosis not present

## 2021-02-13 ENCOUNTER — Ambulatory Visit: Payer: Medicaid Other

## 2021-02-13 ENCOUNTER — Other Ambulatory Visit: Payer: Self-pay

## 2021-02-13 ENCOUNTER — Ambulatory Visit
Admission: RE | Admit: 2021-02-13 | Discharge: 2021-02-13 | Disposition: A | Discharge status: Home / Self Care | Payer: Medicaid Other | Source: Ambulatory Visit | Attending: Radiation Oncology | Admitting: Radiation Oncology

## 2021-02-13 DIAGNOSIS — Z510 Encounter for antineoplastic radiation therapy: Secondary | ICD-10-CM | POA: Diagnosis not present

## 2021-02-14 ENCOUNTER — Ambulatory Visit: Payer: Medicaid Other | Admitting: Radiation Oncology

## 2021-02-14 ENCOUNTER — Ambulatory Visit: Payer: Medicaid Other

## 2021-02-14 ENCOUNTER — Ambulatory Visit
Admission: RE | Admit: 2021-02-14 | Discharge: 2021-02-14 | Disposition: A | Discharge status: Home / Self Care | Payer: Medicaid Other | Source: Ambulatory Visit | Attending: Radiation Oncology | Admitting: Radiation Oncology

## 2021-02-14 DIAGNOSIS — Z510 Encounter for antineoplastic radiation therapy: Secondary | ICD-10-CM | POA: Diagnosis not present

## 2021-02-17 ENCOUNTER — Other Ambulatory Visit: Payer: Self-pay

## 2021-02-17 ENCOUNTER — Ambulatory Visit: Payer: Medicaid Other

## 2021-02-17 ENCOUNTER — Ambulatory Visit
Admission: RE | Admit: 2021-02-17 | Discharge: 2021-02-17 | Disposition: A | Discharge status: Home / Self Care | Payer: Medicaid Other | Source: Ambulatory Visit | Attending: Radiation Oncology | Admitting: Radiation Oncology

## 2021-02-17 DIAGNOSIS — Z510 Encounter for antineoplastic radiation therapy: Secondary | ICD-10-CM | POA: Diagnosis not present

## 2021-02-18 ENCOUNTER — Ambulatory Visit: Payer: Medicaid Other

## 2021-02-19 ENCOUNTER — Ambulatory Visit: Payer: Medicaid Other

## 2021-02-20 ENCOUNTER — Ambulatory Visit: Payer: Medicaid Other

## 2021-02-21 ENCOUNTER — Ambulatory Visit: Payer: Medicaid Other | Admitting: Radiation Oncology

## 2021-02-21 ENCOUNTER — Ambulatory Visit
Admission: RE | Admit: 2021-02-21 | Discharge: 2021-02-21 | Disposition: A | Discharge status: Home / Self Care | Payer: Medicaid Other | Source: Ambulatory Visit | Attending: Radiation Oncology | Admitting: Radiation Oncology

## 2021-02-21 ENCOUNTER — Other Ambulatory Visit: Payer: Self-pay

## 2021-02-21 ENCOUNTER — Ambulatory Visit: Payer: Medicaid Other

## 2021-02-21 DIAGNOSIS — Z510 Encounter for antineoplastic radiation therapy: Secondary | ICD-10-CM | POA: Diagnosis not present

## 2021-02-24 ENCOUNTER — Ambulatory Visit: Payer: Medicaid Other

## 2021-02-25 ENCOUNTER — Ambulatory Visit: Payer: Medicaid Other

## 2021-02-26 ENCOUNTER — Emergency Department (HOSPITAL_BASED_OUTPATIENT_CLINIC_OR_DEPARTMENT_OTHER)
Admission: EM | Admit: 2021-02-26 | Discharge: 2021-02-26 | Discharge status: Against Medical Advice | Payer: Medicaid Other | Attending: Emergency Medicine | Admitting: Emergency Medicine

## 2021-02-26 ENCOUNTER — Encounter (HOSPITAL_BASED_OUTPATIENT_CLINIC_OR_DEPARTMENT_OTHER): Payer: Self-pay | Admitting: Obstetrics and Gynecology

## 2021-02-26 ENCOUNTER — Ambulatory Visit: Payer: Medicaid Other

## 2021-02-26 ENCOUNTER — Other Ambulatory Visit: Payer: Self-pay

## 2021-02-26 ENCOUNTER — Emergency Department (HOSPITAL_BASED_OUTPATIENT_CLINIC_OR_DEPARTMENT_OTHER): Payer: Medicaid Other

## 2021-02-26 ENCOUNTER — Telehealth: Payer: Self-pay | Admitting: Radiation Oncology

## 2021-02-26 DIAGNOSIS — M25551 Pain in right hip: Secondary | ICD-10-CM | POA: Diagnosis present

## 2021-02-26 DIAGNOSIS — Z853 Personal history of malignant neoplasm of breast: Secondary | ICD-10-CM | POA: Diagnosis not present

## 2021-02-26 DIAGNOSIS — J45909 Unspecified asthma, uncomplicated: Secondary | ICD-10-CM | POA: Diagnosis not present

## 2021-02-26 DIAGNOSIS — Z9012 Acquired absence of left breast and nipple: Secondary | ICD-10-CM | POA: Diagnosis not present

## 2021-02-26 DIAGNOSIS — R531 Weakness: Secondary | ICD-10-CM | POA: Diagnosis not present

## 2021-02-26 DIAGNOSIS — M545 Low back pain, unspecified: Secondary | ICD-10-CM | POA: Diagnosis not present

## 2021-02-26 DIAGNOSIS — M79604 Pain in right leg: Secondary | ICD-10-CM | POA: Diagnosis not present

## 2021-02-26 MED ORDER — OXYCODONE-ACETAMINOPHEN 5-325 MG PO TABS
1.0000 | ORAL_TABLET | ORAL | Status: DC | PRN
  Administered 2021-02-26: 1 via ORAL
  Filled 2021-02-26: qty 1

## 2021-02-26 MED ORDER — HYDROCODONE-ACETAMINOPHEN 5-325 MG PO TABS
1.0000 | ORAL_TABLET | Freq: Once | ORAL | Status: AC
Start: 2021-02-26 — End: 2021-02-26
  Administered 2021-02-26: 1 via ORAL
  Filled 2021-02-26: qty 1

## 2021-02-26 NOTE — Telephone Encounter (Signed)
I called and left a voicemail since she has missed multiple appts for her treatment. I encouraged her to call us back, but also noticed that she is currently listed as being in the ED. I will reach out to her ED team as well.

## 2021-02-26 NOTE — ED Triage Notes (Signed)
Patient presents to the ER for right sided hip pain that is shooting down her leg. Patient was sent by her oncologist to rule out DVT. Patient had been on blood thinners up until august and then was taken off. Patient was formerly on eliquis due to PE

## 2021-02-26 NOTE — Discharge Instructions (Signed)
The symptoms of right leg weakness are concerning for a dangerous compression on your spinal nerves.  This would be diagnosed by MRI.  You are leaving [place].  Be aware that by not undergoing further testing, you could put yourself at risk for permanent disability. Please return to the emergency department at anytime if you change your mind about further testing.  If not, please call your primary doctor soon as possible to get set up for outpatient imaging.

## 2021-02-26 NOTE — ED Provider Notes (Signed)
[facility] EMERGENCY DEPT Provider Note   CSN: [id number] Arrival date & time: 02/26/21  1611     History Chief Complaint  Patient presents with   Hip Pain    Latasha Brown is a 40 y.o. female.   Hip Pain Pertinent negatives include no chest pain, no abdominal pain and no shortness of breath. Patient is a 40 year old female with current breast cancer, s/p simple mastectomy, chemotherapy, currently on immunotherapy and daily radiation treatments, presenting for lower back pain that radiates into her right leg.  She denies any recent traumas.  Pain is worsened with movements and ambulation.  He denies any associated numbness or bowel/bladder dysfunction.  He has not had any recent fevers.  She was sent to the ED due to concern of DVT.  She has not noticed any swelling in her legs.     Past Medical History:  Diagnosis Date   Anxiety    Asthma    Breast cancer ([place])    Cancer ([place])    Fibromyalgia    Hyperthyroidism    Migraine    Pulmonary embolism [facility])     Patient Active Problem List   Diagnosis Date Noted   Malignant neoplasm of overlapping sites of left breast in female, estrogen receptor negative ([place]) 12/11/2020    Past Surgical History:  Procedure Laterality Date   BILATERAL SALPINGECTOMY  2016   CHOLECYSTECTOMY     KNEE SURGERY     MASTECTOMY Left 11/19/2020   TONSILLECTOMY     TOOTH EXTRACTION       OB History     Gravida      Para      Term      Preterm      AB      Living  2      SAB      IAB      Ectopic      Multiple      Live Births              No family history on file.  Social History   Tobacco Use   Smoking status: Never   Smokeless tobacco: Never  Vaping Use   Vaping Use: Never used  Substance Use Topics   Alcohol use: Yes    Comment: Occasional   Drug use: Not Currently    Types: Marijuana    Home Medications Prior to Admission medications   Medication Sig Start Date End Date  Taking? Authorizing Provider  acetaminophen (TYLENOL) 325 MG tablet Take 650 mg by mouth every 6 (six) hours as needed.    [provider]  albuterol (VENTOLIN HFA) 108 (90 Base) MCG/ACT inhaler Inhale 2 puffs into the lungs daily as needed for shortness of breath. 05/12/18   [provider]  citalopram (CELEXA) 40 MG tablet Take 1 tablet by mouth daily. 03/02/19 02/25/20  [provider]  ibuprofen (ADVIL) 200 MG tablet Take 600 mg by mouth every 6 (six) hours as needed for moderate pain.    [provider]  ibuprofen (ADVIL) 600 MG tablet Take 600 mg by mouth every 6 (six) hours as needed. 11/19/20   [provider]  levothyroxine (SYNTHROID) 175 MCG tablet Take by mouth. 08/16/20 08/16/21  [provider]  norethindrone (MICRONOR) 0.35 MG tablet Take 1 tablet by mouth daily. 05/12/18 05/12/19  [provider]  ondansetron (ZOFRAN ODT) 4 MG disintegrating tablet Take 1 tablet (4 mg total) by mouth every 8 (eight) hours as  needed for nausea or vomiting. 03/11/19   Walisiewicz, Verline Lema E, PA-C  oxybutynin (DITROPAN) 5 MG tablet Take 5 mg by mouth 3 (three) times daily. 09/06/20   [provider]  SUMAtriptan (IMITREX) 50 MG tablet Take 1 tablet (50 mg total) by mouth daily as needed for migraine. Patient not taking: Reported on 12/18/2020 10/30/19   Mesner, Corene Cornea, MD  topiramate (TOPAMAX) 25 MG tablet Take 1 tablet by mouth daily. 02/02/19 02/02/20  [provider]  traMADol (ULTRAM) 50 MG tablet Take 1 tablet by mouth daily as needed for pain. 08/17/16   [provider]  traMADol Veatrice Bourbon) 50 MG tablet Take by mouth. 11/20/20   [provider]    Allergies    Trazodone and nefazodone  Review of Systems   Review of Systems  Constitutional:  Negative for activity change, chills, fatigue and fever.  HENT:  Negative for ear pain and sore throat.   Eyes:  Negative for pain and visual disturbance.  Respiratory:   Negative for cough and shortness of breath.   Cardiovascular:  Negative for chest pain and palpitations.  Gastrointestinal:  Negative for abdominal pain, diarrhea, nausea and vomiting.  Genitourinary:  Negative for dysuria, flank pain, hematuria and pelvic pain.  Musculoskeletal:  Positive for back pain. Negative for arthralgias, joint swelling, myalgias and neck pain.  Skin:  Negative for color change and rash.  Neurological:  Positive for weakness. Negative for dizziness, seizures, syncope, speech difficulty, light-headedness and numbness.  Hematological:  Does not bruise/bleed easily.  All other systems reviewed and are negative.  Physical Exam Updated Vital Signs BP 108/62   Pulse 67   Temp 98 F (36.7 C)   Resp 18   Ht 5\' 7"  (1.702 m)   Wt 135.2 kg   LMP 05/07/2020 (Approximate)   SpO2 99%   BMI 46.68 kg/m   Physical Exam Vitals and nursing note reviewed.  Constitutional:      General: She is not in acute distress.    Appearance: Normal appearance. She is well-developed. She is not ill-appearing, toxic-appearing or diaphoretic.  HENT:     Head: Normocephalic and atraumatic.     Right Ear: External ear normal.     Left Ear: External ear normal.     Nose: Nose normal.     Mouth/Throat:     Mouth: Mucous membranes are moist.     Pharynx: Oropharynx is clear.  Eyes:     Extraocular Movements: Extraocular movements intact.     Conjunctiva/sclera: Conjunctivae normal.  Cardiovascular:     Rate and Rhythm: Normal rate and regular rhythm.     Heart sounds: No murmur heard. Pulmonary:     Effort: Pulmonary effort is normal. No respiratory distress.     Breath sounds: Normal breath sounds.  Chest:     Chest wall: No tenderness.  Abdominal:     Palpations: Abdomen is soft.     Tenderness: There is no abdominal tenderness.  Musculoskeletal:        General: Tenderness present. Normal range of motion.     Cervical back: Neck supple.     Right lower leg: No edema.      Left lower leg: No edema.  Skin:    General: Skin is warm and dry.     Coloration: Skin is not jaundiced or pale.  Neurological:     Mental Status: She is alert and oriented to person, place, and time.     Cranial Nerves: No cranial nerve deficit.  Sensory: No sensory deficit.     Motor: Weakness (Slightly diminished strength with right hip flexion and right knee extension) present.  Psychiatric:        Mood and Affect: Mood normal.        Behavior: Behavior normal.    ED Results / Procedures / Treatments   Labs (all labs ordered are listed, but only abnormal results are displayed) Labs Reviewed - No data to display  EKG None  Radiology CT Lumbar Spine Wo Contrast  Result Date: 02/26/2021 CLINICAL DATA:  Initial evaluation for progressive low back pain, right hip pain with extension into the right lower extremity. EXAM: CT LUMBAR SPINE WITHOUT CONTRAST TECHNIQUE: Multidetector CT imaging of the lumbar spine was performed without intravenous contrast administration. Multiplanar CT image reconstructions were also generated. COMPARISON:  None available. FINDINGS: Segmentation: Standard. Lowest well-formed disc space labeled the L5-S1 level. Alignment: Physiologic with preservation of the normal lumbar lordosis. No listhesis. Vertebrae: Vertebral body height maintained without acute or chronic fracture. Visualized sacrum and pelvis intact. SI joints symmetric and normal. No discrete or worrisome osseous lesions. Paraspinal and other soft tissues: Paraspinous soft tissues within normal limits. Prior cholecystectomy noted. Moderate retained stool within the visualized colon, which could reflect constipation. Remainder the visualized visceral structures otherwise unremarkable. Disc levels: L1-2: Normal interspace. Mild bilateral facet hypertrophy. No canal or foraminal stenosis. L2-3: Negative interspace. Minimal facet spurring. No canal or foraminal stenosis. L3-4: Mild diffuse disc bulge.  Superimposed shallow right foraminal to extraforaminal disc protrusion closely approximates the exiting right L3 nerve root (series 5, image 80). Minimal facet spurring. No significant spinal stenosis. Mild right L3 foraminal narrowing. Left neural foramina remains patent. L4-5: Mild disc bulge, asymmetric to the left. Superimposed shallow broad-based left foraminal to extraforaminal disc protrusion closely approximates the exiting left L4 nerve root (series 5, image 96). Mild facet hypertrophy. Probable borderline mild narrowing of the lateral recesses bilaterally. Central canal remains patent. Mild left L4 foraminal stenosis. Right neural foramen remains patent. L5-S1: Mild degenerative intervertebral disc space narrowing with shallow posterior disc bulge. Mild facet hypertrophy. No significant canal stenosis. Foramina remain patent. IMPRESSION: 1. No acute abnormality within the lumbar spine. 2. Small right foraminal to extraforaminal disc protrusion at L3-4, closely approximating and potentially irritating the exiting right L3 nerve root. 3. Left foraminal to extraforaminal disc protrusion at L4-5, potentially affecting the exiting left L4 nerve root. 4. Moderate retained stool within the visualized colon, which could reflect constipation. Electronically Signed   By: Jeannine Boga M.D.   On: 02/26/2021 20:53   US Venous Img Lower Unilateral Right  Result Date: 02/26/2021 CLINICAL DATA:  Right lower extremity pain and edema EXAM: Right LOWER EXTREMITY VENOUS DOPPLER ULTRASOUND TECHNIQUE: Gray-scale sonography with compression, as well as color and duplex ultrasound, were performed to evaluate the deep venous system(s) from the level of the common femoral vein through the popliteal and proximal calf veins. COMPARISON:  None. FINDINGS: VENOUS Normal compressibility of the common femoral, superficial femoral, and popliteal veins, as well as the visualized calf veins. Visualized portions of profunda  femoral vein and great saphenous vein unremarkable. No filling defects to suggest DVT on grayscale or color Doppler imaging. Doppler waveforms show normal direction of venous flow, normal respiratory plasticity and response to augmentation. Limited views of the contralateral common femoral vein are unremarkable. OTHER None. Limitations: none IMPRESSION: Negative. Electronically Signed   By: Donavan Foil M.D.   On: 02/26/2021 18:38    Procedures Procedures  Medications Ordered in ED Medications  oxyCODONE-acetaminophen (PERCOCET/ROXICET) 5-325 MG per tablet 1 tablet (1 tablet Oral Given 02/26/21 1636)  HYDROcodone-acetaminophen (NORCO/VICODIN) 5-325 MG per tablet 1 tablet (1 tablet Oral Given 02/26/21 2238)    ED Course  I have reviewed the triage vital signs and the nursing notes.  Pertinent labs & imaging results that were available during my care of the patient were reviewed by me and considered in my medical decision making (see chart for details).    MDM Rules/Calculators/A&P                          Patient is a 40 year old female with breast cancer, currently undergoing radiation and immunotherapy, presenting for pain in her lower back that radiates down her right leg.  Prior to being bedded in the ED, DVT study was obtained which was negative.  On assessment, patient does have tenderness in the area of her lower back, primarily on the right side.  She does not have any diminished sensation.  She does have slightly diminished strength in her right leg with hip flexion and knee extension.  She was able to ambulate in the exam room but did favor her left leg.  I discussed the concerns of possible spinal nerve impingement given the associated leg weakness.  Patient was initially agreeable to transfer to [facility] ED for MRI.  While awaiting transfer, CT scan was obtained which did show some disc protrusions which were affecting the right L3 nerve and the left L4 nerve.  Patient was  accepted in transfer to [facility], ED by Dr. Karle Starch.  MRI study was ordered.  When transport team arrived to take the patient to [facility], she stated that she no longer wished to go there.  Patient stated that she simply wanted to go home.  She is tired of being in hospitals.  She was advised that by going home, she would be leaving [facility].  She did understand that foregoing further imaging studies could result in failure to diagnose a condition that could lead to permanent disability.  She was encouraged to return to the ED at any time if she does change her mind.  Patient stated that she would follow-up with her regular doctors for scheduling of outpatient imaging studies.  Patient left AMA.  Final Clinical Impression(s) / ED Diagnoses Final diagnoses:  Right hip pain    Rx / DC Orders ED Discharge Orders     None        Godfrey Pick, MD 02/27/21 [phone]

## 2021-02-27 ENCOUNTER — Ambulatory Visit: Payer: Medicaid Other

## 2021-02-27 ENCOUNTER — Telehealth: Payer: Self-pay | Admitting: Radiation Oncology

## 2021-02-27 NOTE — Telephone Encounter (Addendum)
Attempted to callr pt's aunt, Encarnacion Chu to ask for her to help [place] make contact with Ms. Sanson since she's missed appointments in our office. Unfortunately she does not have VM set up.

## 2021-02-28 ENCOUNTER — Ambulatory Visit: Payer: Medicaid Other | Admitting: Radiation Oncology

## 2021-02-28 ENCOUNTER — Ambulatory Visit: Payer: Medicaid Other

## 2021-03-03 ENCOUNTER — Ambulatory Visit: Payer: Medicaid Other

## 2021-03-04 ENCOUNTER — Ambulatory Visit: Payer: Self-pay

## 2021-03-04 ENCOUNTER — Telehealth: Payer: Self-pay | Admitting: *Deleted

## 2021-03-04 ENCOUNTER — Ambulatory Visit: Payer: Self-pay | Attending: Radiation Oncology

## 2021-03-04 DIAGNOSIS — Z171 Estrogen receptor negative status [ER-]: Secondary | ICD-10-CM | POA: Insufficient documentation

## 2021-03-04 DIAGNOSIS — C50812 Malignant neoplasm of overlapping sites of left female breast: Secondary | ICD-10-CM | POA: Insufficient documentation

## 2021-03-04 DIAGNOSIS — Z510 Encounter for antineoplastic radiation therapy: Secondary | ICD-10-CM | POA: Insufficient documentation

## 2021-03-04 NOTE — Telephone Encounter (Signed)
02/28/2021:  Spoke with the patients mother in an attempt to get in contact with the patient.  She has missed a full week of radiation treatments.  Mom voiced she would give her a call and have her call the office to give [place] an update.  Will continue to follow as necessary.  Gloriajean Dell. Leonie Green, BSN

## 2021-03-05 ENCOUNTER — Telehealth: Payer: Self-pay | Admitting: *Deleted

## 2021-03-05 ENCOUNTER — Ambulatory Visit: Payer: Self-pay

## 2021-03-05 NOTE — Telephone Encounter (Signed)
Attempted to speak with the patient to check in as she has missed several radiation treatments.  Left her a voicemail asking for return call [phone]).  Gloriajean Dell. Leonie Green, BSN

## 2021-03-06 ENCOUNTER — Encounter: Payer: Self-pay | Admitting: Radiation Oncology

## 2021-03-06 ENCOUNTER — Ambulatory Visit: Payer: Self-pay

## 2021-03-06 NOTE — Progress Notes (Signed)
Despite multiple attempts, we have been unable to reach the patient for the last 2 weeks.  We have attempted to reach out to her family members as well.  She has no showed for radiotherapy, with her last treatment being on 02/21/2021.  Dr. Lisbeth Renshaw has decided to end her treatment course.  We will follow-up with her other providers.

## 2021-03-07 ENCOUNTER — Ambulatory Visit: Payer: Self-pay

## 2021-03-07 ENCOUNTER — Ambulatory Visit: Payer: Self-pay | Admitting: Radiation Oncology

## 2021-03-10 ENCOUNTER — Ambulatory Visit: Payer: Self-pay

## 2021-03-11 ENCOUNTER — Ambulatory Visit: Payer: Self-pay

## 2021-03-12 ENCOUNTER — Ambulatory Visit: Payer: Self-pay

## 2021-03-13 ENCOUNTER — Ambulatory Visit: Payer: Self-pay

## 2021-03-14 ENCOUNTER — Ambulatory Visit: Payer: Self-pay | Admitting: Radiation Oncology

## 2021-03-14 ENCOUNTER — Ambulatory Visit: Payer: Self-pay

## 2021-03-17 ENCOUNTER — Ambulatory Visit: Payer: Self-pay

## 2021-03-18 ENCOUNTER — Ambulatory Visit: Payer: Self-pay

## 2021-03-18 ENCOUNTER — Encounter: Payer: Self-pay | Admitting: Radiation Oncology

## 2021-03-19 ENCOUNTER — Ambulatory Visit: Payer: Self-pay

## 2021-03-20 ENCOUNTER — Ambulatory Visit: Payer: Self-pay

## 2021-03-21 ENCOUNTER — Ambulatory Visit: Payer: Self-pay

## 2021-03-24 ENCOUNTER — Ambulatory Visit: Payer: Self-pay

## 2021-03-25 ENCOUNTER — Ambulatory Visit: Payer: Self-pay

## 2021-03-26 ENCOUNTER — Ambulatory Visit: Payer: Self-pay

## 2021-03-27 ENCOUNTER — Ambulatory Visit: Payer: Self-pay

## 2021-03-28 ENCOUNTER — Ambulatory Visit: Payer: Self-pay

## 2021-03-31 ENCOUNTER — Ambulatory Visit: Payer: Self-pay

## 2021-04-03 NOTE — Progress Notes (Signed)
  Radiation Oncology         [phone] ________________________________  Name: Kairy Folsom MRN: [id number]  Date: 03/18/2021  DOB: [birth year]  End of Treatment Note  Diagnosis:   left-sided breast cancer     Indication for treatment:  Curative       Radiation treatment dates:   01/28/21 - 02/21/21  Site/dose:   The patient was planned initially to receive a dose of 50.4 Gy in 28 fractions to the chest wall and supraclavicular region. This was to be delivered using a 3-D conformal, 4 field technique. The patient was then planned to receive a boost to the mastectomy scar. This was to deliver an additional 10 Gy in 5 fractions using an en face electron field. The total dose was to be 60.4 Gy.  Narrative: The patient tolerated radiation treatment relatively well but she did not present for further treatment after 02/21/2021.  At that time, the patient had received 25.2 Gray in 14 fractions.  Multiple attempts were made to contact her and resume treatment, but these were unsuccessful.  Plan: The patient 's treatment therefore was canceled and she was taken off of our schedule.  She will return to clinic on a as needed basis.  We would certainly be happy to see her if we can be of any further assistance. ________________________________  Jodelle Gross, M.D., Ph.D.

## 2021-08-04 ENCOUNTER — Other Ambulatory Visit: Payer: Self-pay

## 2021-08-04 ENCOUNTER — Encounter (HOSPITAL_BASED_OUTPATIENT_CLINIC_OR_DEPARTMENT_OTHER): Payer: Self-pay

## 2021-08-04 ENCOUNTER — Other Ambulatory Visit (HOSPITAL_BASED_OUTPATIENT_CLINIC_OR_DEPARTMENT_OTHER): Payer: Self-pay

## 2021-08-04 ENCOUNTER — Emergency Department (HOSPITAL_BASED_OUTPATIENT_CLINIC_OR_DEPARTMENT_OTHER)
Admission: EM | Admit: 2021-08-04 | Discharge: 2021-08-04 | Disposition: A | Discharge status: Home / Self Care | Payer: Self-pay | Attending: Emergency Medicine | Admitting: Emergency Medicine

## 2021-08-04 DIAGNOSIS — K0889 Other specified disorders of teeth and supporting structures: Secondary | ICD-10-CM | POA: Insufficient documentation

## 2021-08-04 DIAGNOSIS — C50919 Malignant neoplasm of unspecified site of unspecified female breast: Secondary | ICD-10-CM | POA: Insufficient documentation

## 2021-08-04 DIAGNOSIS — R079 Chest pain, unspecified: Secondary | ICD-10-CM | POA: Insufficient documentation

## 2021-08-04 MED ORDER — AMOXICILLIN-POT CLAVULANATE 875-125 MG PO TABS
1.0000 | ORAL_TABLET | Freq: Two times a day (BID) | ORAL | 0 refills | Status: AC
  Filled 2021-08-04: qty 20, 10d supply, fill #0

## 2021-08-04 MED ORDER — HYDROMORPHONE HCL 1 MG/ML IJ SOLN
1.0000 mg | Freq: Once | INTRAMUSCULAR | Status: AC
  Administered 2021-08-04: 1 mg via INTRAMUSCULAR
  Filled 2021-08-04: qty 1

## 2021-08-04 NOTE — ED Triage Notes (Signed)
Pt arrives tearful to triage with c/o pain to right teeth. Back right teeth broken. Pt states that she was being treated for breast cancer and her teeth broke r/t chemo. Dental will not see her until she gets her port out April 11th.  ?

## 2021-08-04 NOTE — ED Notes (Signed)
Pt states family member will be picking her up.  ?

## 2021-08-04 NOTE — Discharge Instructions (Addendum)
An antibiotic has been sent to your pharmacy.  Please take this for the full course (10 days).  Continue use of anti-inflammatories (ibuprofen) and tylenol until your follow-up with your dentist ? ?Please follow-up with your dentist in the next 24 to 48 hours as discussed for reevaluation and continued medical management ? ?Return to the ED for new or worsening symptoms as discussed ?

## 2021-08-04 NOTE — ED Provider Notes (Signed)
?[facility] EMERGENCY DEPARTMENT ?Provider Note ? ? ?CSN: [id number] ?Arrival date & time: 08/04/21  1002 ? ?  ? ?History ? ?Chief Complaint  ?Patient presents with  ? Dental Pain  ? ? ?Latasha Brown is a 41 y.o. female with chief complaint of chest pain.  Patient currently receiving treatment for breast cancer.  Stated her right lower tooth began breaking apart about 2 weeks ago, consisting of three fragments.  The most recent fragment broke off last week on Wednesday.  Pain started yesterday/today, described as sharp, and rated 10/10.  No history of prior issue to this tooth.  No complaints of other teeth at this time.  Patient states she thinks he may have had a fever this morning when she woke up and felt sweaty, but did not measure her temperature.  Saw a dentist this morning who informed her they will not intervene until her line is removed, which isn't until 4/11.  States she was not provided with pain medicine or antibiotic at today's earlier visit.  Currently on Tramadol, which pt says does not help her pain.  Last taken 12 hrs ago.  Denies discharge from or around tooth.  Denies chest pain, drooling, dysphagia, or stiff neck.  Mild difficulty opening jaw due to tenderness. ? ?The history is provided by the patient and medical records.  ?Dental Pain ? ?  ? ?Home Medications ?Prior to Admission medications   ?Medication Sig Start Date End Date Taking? Authorizing Provider  ?amoxicillin-clavulanate (AUGMENTIN) 875-125 MG tablet Take 1 tablet by mouth every 12 (twelve) hours for 10 days. 08/04/21 08/14/21 Yes Prince Rome, PA-C  ?acetaminophen (TYLENOL) 325 MG tablet Take 650 mg by mouth every 6 (six) hours as needed.    [provider]  ?albuterol (VENTOLIN HFA) 108 (90 Base) MCG/ACT inhaler Inhale 2 puffs into the lungs daily as needed for shortness of breath. 05/12/18   [provider]  ?citalopram (CELEXA) 40 MG tablet Take 1 tablet by mouth daily. 03/02/19 02/25/20   [provider]  ?ibuprofen (ADVIL) 200 MG tablet Take 600 mg by mouth every 6 (six) hours as needed for moderate pain.    [provider]  ?ibuprofen (ADVIL) 600 MG tablet Take 600 mg by mouth every 6 (six) hours as needed. 11/19/20   [provider]  ?levothyroxine (SYNTHROID) 175 MCG tablet Take by mouth. 08/16/20 08/16/21  [provider]  ?norethindrone (MICRONOR) 0.35 MG tablet Take 1 tablet by mouth daily. 05/12/18 05/12/19  [provider]  ?ondansetron (ZOFRAN ODT) 4 MG disintegrating tablet Take 1 tablet (4 mg total) by mouth every 8 (eight) hours as needed for nausea or vomiting. 03/11/19   Barrie Folk, PA-C  ?oxybutynin (DITROPAN) 5 MG tablet Take 5 mg by mouth 3 (three) times daily. 09/06/20   [provider]  ?SUMAtriptan (IMITREX) 50 MG tablet Take 1 tablet (50 mg total) by mouth daily as needed for migraine. ?Patient not taking: Reported on 12/18/2020 10/30/19   Mesner, Corene Cornea, MD  ?topiramate (TOPAMAX) 25 MG tablet Take 1 tablet by mouth daily. 02/02/19 02/02/20  [provider]  ?traMADol (ULTRAM) 50 MG tablet Take 1 tablet by mouth daily as needed for pain. 08/17/16   [provider]  ?traMADol Veatrice Bourbon) 50 MG tablet Take by mouth. 11/20/20   [provider]  ?   ? ?Allergies    ?Trazodone and nefazodone   ? ?Review of Systems   ?Review of Systems  ?HENT:  Positive for  dental problem.   ? ?Physical Exam ?Updated Vital Signs ?BP 140/90 (BP Location: Left Wrist)   Pulse 70   Temp 99.2 ?F (37.3 ?C) (Oral)   Resp 18   Ht '5\' 6"'$  (1.676 m)   Wt 126.1 kg   SpO2 96%   BMI 44.87 kg/m?  ?Physical Exam ?Vitals and nursing note reviewed.  ?Constitutional:   ?   General: She is not in acute distress. ?   Appearance: Normal appearance. She is well-developed. She is obese. She is not ill-appearing or diaphoretic.  ?HENT:  ?   Head: Normocephalic and atraumatic.  ?   Mouth/Throat:  ?   Lips: Pink.  ?   Mouth: Mucous membranes are  moist. No oral lesions.  ?   Dentition: Abnormal dentition. Dental tenderness present. No gingival swelling, dental abscesses or gum lesions.  ?   Tongue: No lesions. Tongue does not deviate from midline.  ?   Palate: No mass and lesions.  ?   Pharynx: Uvula midline. No pharyngeal swelling, oropharyngeal exudate, posterior oropharyngeal erythema or uvula swelling.  ? ?   Comments: No evidence of dental abscess, discharge, or inflammation ?Missing areas of affected tooth as indicated above ?Eyes:  ?   General: No scleral icterus. ?   Conjunctiva/sclera: Conjunctivae normal.  ?Cardiovascular:  ?   Rate and Rhythm: Normal rate and regular rhythm.  ?   Pulses: Normal pulses.  ?   Heart sounds: Normal heart sounds. No murmur heard. ?Pulmonary:  ?   Effort: Pulmonary effort is normal. No respiratory distress.  ?   Breath sounds: Normal breath sounds.  ?Abdominal:  ?   Palpations: Abdomen is soft.  ?Musculoskeletal:     ?   General: No swelling.  ?   Cervical back: Neck supple.  ?Skin: ?   General: Skin is warm and dry.  ?   Capillary Refill: Capillary refill takes less than 2 seconds.  ?Neurological:  ?   Mental Status: She is alert and oriented to person, place, and time.  ?Psychiatric:     ?   Mood and Affect: Mood normal.  ? ? ?ED Results / Procedures / Treatments   ?Labs ?(all labs ordered are listed, but only abnormal results are displayed) ?Labs Reviewed - No data to display ? ?EKG ?None ? ?Radiology ?No results found. ? ?Procedures ?Procedures  ? ? ?Medications Ordered in ED ?Medications  ?HYDROmorphone (DILAUDID) injection 1 mg (1 mg Intramuscular Given 08/04/21 1121)  ? ? ?ED Course/ Medical Decision Making/ A&P ?  ?                        ?Medical Decision Making ?Amount and/or Complexity of Data Reviewed ?External Data Reviewed: notes. ?Labs:  Decision-making details documented in ED Course. ?Radiology:  Decision-making details documented in ED Course. ?ECG/medicine tests:  Decision-making details documented in  ED Course. ? ?Risk ?OTC drugs. ?Prescription drug management. ? ? ?Patient with toothache.  Affected tooth appears to be #29, broken on physical exam.  No gross abscess visualized.  Afebrile.  Exam unconcerning for Ludwig's angina or spread of infection.  No new murmurs, rubs or gallops on exam.  Vitals stable.  Patient appears to be in significant pain and already takes tramadol, with most recent dose 12 hrs ago.  Has prescription for gabapentin but states she has not filled it.  Has attempted alternating ibuprofen and tylenol with no relief.  Provided one time dose of opioid medication due  to failed anti-inflammatory and tramadol management, and severity of pain.  Upon re-evaluation of pt, appreciated significant improvement of Pt's pain.  Will treat with antibiotics and anti-inflammatories.  Urged patient to follow-up again with dentist for continued medical management.   ? ?Emergency department workup does not suggest an emergent condition requiring admission or immediate intervention beyond  what has been performed at this time.  The patient is safe for discharge and has been instructed to return immediately for worsening symptoms, change in symptoms or any other concerns.  ? ?I discussed the patient and their case with my attending, Dr. Billy Fischer, who agreed with the proposed treatment course.  After consideration of the diagnostic results and the patients response to treatment, I feel that the patient would benefit from antibiotic prophylaxis and urgent outpatient follow up with her dentist.  Discussed course of treatment thoroughly with the patient and she demonstrated understanding.  Patient in agreement and has no further questions. ? ? ?This chart was dictated using voice recognition software.  Despite best efforts to proofread,  errors can occur which can change the documentation meaning. ? ? ? ? ? ? ? ?Final Clinical Impression(s) / ED Diagnoses ?Final diagnoses:  ?Pain, dental  ? ? ?Rx / DC Orders ?ED  Discharge Orders   ? ?      Ordered  ?  amoxicillin-clavulanate (AUGMENTIN) 875-125 MG tablet  Every 12 hours       ? 08/04/21 1156  ? ?  ?  ? ?  ? ? ?  ?Prince Rome, PA-C ?26/41/58 1048 ? ?  ?[place], E

## 2021-08-20 ENCOUNTER — Other Ambulatory Visit: Payer: Self-pay

## 2021-08-20 ENCOUNTER — Encounter (HOSPITAL_BASED_OUTPATIENT_CLINIC_OR_DEPARTMENT_OTHER): Payer: Self-pay

## 2021-08-20 ENCOUNTER — Emergency Department (HOSPITAL_BASED_OUTPATIENT_CLINIC_OR_DEPARTMENT_OTHER)
Admission: EM | Admit: 2021-08-20 | Discharge: 2021-08-20 | Disposition: A | Discharge status: Home / Self Care | Payer: Self-pay | Attending: Emergency Medicine | Admitting: Emergency Medicine

## 2021-08-20 DIAGNOSIS — L509 Urticaria, unspecified: Secondary | ICD-10-CM | POA: Insufficient documentation

## 2021-08-20 MED ORDER — HYDROXYZINE HCL 25 MG PO TABS: 25.0000 mg | ORAL_TABLET | Freq: Four times a day (QID) | ORAL | 0 refills | Status: AC

## 2021-08-20 MED ORDER — PREDNISONE 20 MG PO TABS: ORAL_TABLET | ORAL | 0 refills | Status: DC

## 2021-08-20 MED ORDER — PREDNISONE 50 MG PO TABS
60.0000 mg | ORAL_TABLET | Freq: Once | ORAL | Status: AC
  Administered 2021-08-20: 60 mg via ORAL
  Filled 2021-08-20: qty 1

## 2021-08-20 NOTE — ED Triage Notes (Signed)
C/o generalized hives since Sunday. Taking benadryl and cortisone cream without relief. Denies trouble breathing/swallowing. Finished chemo January 11th. ?

## 2021-08-20 NOTE — Discharge Instructions (Addendum)
Please read and follow all provided instructions. ? ?Your diagnoses today include:  ?1. Urticaria   ? ? ?Tests performed today include: ?Vital signs. See below for your results today.  ? ?Medications prescribed:  ?Prednisone - steroid medicine  ? ?It is best to take this medication in the morning to prevent sleeping problems. If you are diabetic, monitor your blood sugar closely and stop taking Prednisone if blood sugar is over 300. Take with food to prevent stomach upset.  ? ?Hydroxyzine - antihistamine ? ?You can find this medication over-the-counter.  ? ?This medication will make you drowsy. DO NOT drive or perform any activities that require you to be awake and alert if taking this. ? ?Take any prescribed medications only as directed. ? ?Home care instructions:  ?Follow any educational materials contained in this packet ? ?Follow-up instructions: ?Please follow-up with your primary care provider in the next 3 days for further evaluation of your symptoms.  ? ?Return instructions:  ?Please return to the Emergency Department if you experience worsening symptoms.  ?Call [phone] immediately if you have an allergic reaction that involves your lips, mouth, throat or if you have any difficulty breathing. This is a life-threatening emergency.  ?Please return if you have any other emergent concerns. ? ?Additional Information: ? ?Your vital signs today were: ?BP (!) 133/97 (BP Location: Right Arm)   Pulse 93   Temp 98.5 ?F (36.9 ?C) (Oral)   Resp 18   Ht '5\' 6"'$  (1.676 m)   Wt 127.9 kg   SpO2 98%   BMI 45.52 kg/m?  ?If your blood pressure (BP) was elevated above 135/85 this visit, please have this repeated by your doctor within one month. ?-------------- ? ?

## 2021-08-20 NOTE — ED Provider Notes (Signed)
?[facility] ?Provider Note ? ? ?CSN: [id number] ?Arrival date & time: 08/20/21  1540 ? ?  ? ?History ? ?Chief Complaint  ?Patient presents with  ? Urticaria  ? ? ?Latasha Brown is a 41 y.o. female. ? ?Patient with history of breast cancer, completed therapy January 2023 presents to the emergency department for evaluation of rash.  Symptoms started about 3 days ago.  Patient developed a full body urticarial rash that is both itching and burning.  No lip, facial, or tongue swelling.  No fevers or systemic symptoms of illness.  Patient has been taking Benadryl and applying hydrocortisone cream.  Initially she thought it was improving, but then symptoms worsened.  Patient completed a course of Augmentin for a dental infection about a week ago.  Symptoms started several days after completing the antibiotic.  No other new food or medication exposures.  No intraoral lesions or ulcerations.  No recent travel or tick bites. ? ? ?  ? ?Home Medications ?Prior to Admission medications   ?Medication Sig Start Date End Date Taking? Authorizing Provider  ?hydrOXYzine (ATARAX) 25 MG tablet Take 1 tablet (25 mg total) by mouth every 6 (six) hours. 08/20/21  Yes Carlisle Cater, PA-C  ?predniSONE (DELTASONE) 20 MG tablet 3 Tabs PO Days 1-3, then 2 tabs PO Days 4-6, then 1 tab PO Day 7-9, then Half Tab PO Day 10-12 08/20/21  Yes Carlisle Cater, PA-C  ?acetaminophen (TYLENOL) 325 MG tablet Take 650 mg by mouth every 6 (six) hours as needed.    [provider]  ?albuterol (VENTOLIN HFA) 108 (90 Base) MCG/ACT inhaler Inhale 2 puffs into the lungs daily as needed for shortness of breath. 05/12/18   [provider]  ?citalopram (CELEXA) 40 MG tablet Take 1 tablet by mouth daily. 03/02/19 02/25/20  [provider]  ?ibuprofen (ADVIL) 200 MG tablet Take 600 mg by mouth every 6 (six) hours as needed for moderate pain.    [provider]  ?ibuprofen (ADVIL) 600 MG tablet Take  600 mg by mouth every 6 (six) hours as needed. 11/19/20   [provider]  ?levothyroxine (SYNTHROID) 175 MCG tablet Take by mouth. 08/16/20 08/16/21  [provider]  ?norethindrone (MICRONOR) 0.35 MG tablet Take 1 tablet by mouth daily. 05/12/18 05/12/19  [provider]  ?ondansetron (ZOFRAN ODT) 4 MG disintegrating tablet Take 1 tablet (4 mg total) by mouth every 8 (eight) hours as needed for nausea or vomiting. 03/11/19   Barrie Folk, PA-C  ?oxybutynin (DITROPAN) 5 MG tablet Take 5 mg by mouth 3 (three) times daily. 09/06/20   [provider]  ?SUMAtriptan (IMITREX) 50 MG tablet Take 1 tablet (50 mg total) by mouth daily as needed for migraine. ?Patient not taking: Reported on 12/18/2020 10/30/19   Mesner, Corene Cornea, MD  ?topiramate (TOPAMAX) 25 MG tablet Take 1 tablet by mouth daily. 02/02/19 02/02/20  [provider]  ?traMADol (ULTRAM) 50 MG tablet Take 1 tablet by mouth daily as needed for pain. 08/17/16   [provider]  ?traMADol Veatrice Bourbon) 50 MG tablet Take by mouth. 11/20/20   [provider]  ?   ? ?Allergies    ?Trazodone and nefazodone   ? ?Review of Systems   ?Review of Systems ? ?Physical Exam ?Updated Vital Signs ?BP (!) 133/97 (BP Location: Right Arm)   Pulse 93   Temp 98.5 ?F (36.9 ?C) (Oral)   Resp 18   Ht '5\' 6"'$  (1.676 m)  Wt 127.9 kg   SpO2 98%   BMI 45.52 kg/m?  ?Physical Exam ?Vitals and nursing note reviewed.  ?Constitutional:   ?   Appearance: She is well-developed.  ?HENT:  ?   Head: Normocephalic and atraumatic.  ?   Right Ear: External ear normal.  ?   Left Ear: External ear normal.  ?   Mouth/Throat:  ?   Comments: No intraoral lesions. ?Eyes:  ?   Conjunctiva/sclera: Conjunctivae normal.  ?   Comments: Normal conjunctiva  ?Cardiovascular:  ?   Rate and Rhythm: Normal rate and regular rhythm.  ?Pulmonary:  ?   Effort: No respiratory distress.  ?Abdominal:  ?   Palpations: Abdomen is soft.  ?Musculoskeletal:  ?   Cervical  back: Normal range of motion and neck supple.  ?Skin: ?   General: Skin is warm and dry.  ?   Findings: No rash.  ?   Comments: Patient with urticarial type rash involving all extremities, torso, abdomen and back.  Less apparent on the face and neck.  Spares palms.  ?Neurological:  ?   Mental Status: She is alert.  ? ? ?ED Results / Procedures / Treatments   ?Labs ?(all labs ordered are listed, but only abnormal results are displayed) ?Labs Reviewed - No data to display ? ?EKG ?None ? ?Radiology ?No results found. ? ?Procedures ?Procedures  ? ? ?Medications Ordered in ED ?Medications  ?predniSONE (DELTASONE) tablet 60 mg (60 mg Oral Given 08/20/21 1610)  ? ? ?ED Course/ Medical Decision Making/ A&P ?  ? ?Patient seen and examined.   ? ?Labs/EKG: None ? ?Imaging: None ordered ? ?Medications/Fluids: Prednisone ? ?Most recent vital signs reviewed and are as follows: ?BP (!) 133/97 (BP Location: Right Arm)   Pulse 93   Temp 98.5 ?F (36.9 ?C) (Oral)   Resp 18   Ht '5\' 6"'$  (1.676 m)   Wt 127.9 kg   SpO2 98%   BMI 45.52 kg/m?  ? ?Initial impression: Urticarial rash, suspect allergy ? ?Home treatment plan: Prednisone and hydroxyzine ? ?Return instructions discussed with patient: Difficulty breathing, facial swelling, fever ? ?Follow-up instructions discussed with patient: If not improving in the next 1 week, follow-up with her primary care provider. ? ?                        ?Medical Decision Making ?Risk ?Prescription drug management. ? ? ?Patient presents with generalized urticaria.  No signs of anaphylaxis.  Low concern for coxsackie disease or other viral infection.  Patient does work in a preschool setting.  No fevers.  No recent tick bites and low concern for tickborne illness.  Patient appears well, nontoxic.  Do not feel that lab work or further testing indicated at this time.  Will give trial of prednisone and hydroxyzine for symptom relief.  No concern for Stevens-Johnson syndrome, TENS this point.  No  mucosal involvement. ? ? ? ? ? ? ? ?Final Clinical Impression(s) / ED Diagnoses ?Final diagnoses:  ?Urticaria  ? ? ?Rx / DC Orders ?ED Discharge Orders   ? ?      Ordered  ?  predniSONE (DELTASONE) 20 MG tablet       ? 08/20/21 1612  ?  hydrOXYzine (ATARAX) 25 MG tablet  Every 6 hours       ? 08/20/21 1612  ? ?  ?  ? ?  ? ? ?  ?Carlisle Cater, PA-C ?08/20/21 1629 ? ?  ?Tyrone Nine,  Linna Hoff, DO ?08/20/21 1725 ? ?

## 2022-02-12 ENCOUNTER — Encounter (HOSPITAL_BASED_OUTPATIENT_CLINIC_OR_DEPARTMENT_OTHER): Payer: Self-pay

## 2022-02-12 ENCOUNTER — Other Ambulatory Visit: Payer: Self-pay

## 2022-02-12 ENCOUNTER — Emergency Department (HOSPITAL_BASED_OUTPATIENT_CLINIC_OR_DEPARTMENT_OTHER)
Admission: EM | Admit: 2022-02-12 | Discharge: 2022-02-12 | Disposition: A | Discharge status: Home / Self Care | Payer: Self-pay | Attending: Emergency Medicine | Admitting: Emergency Medicine

## 2022-02-12 DIAGNOSIS — M5432 Sciatica, left side: Secondary | ICD-10-CM

## 2022-02-12 DIAGNOSIS — M5442 Lumbago with sciatica, left side: Secondary | ICD-10-CM | POA: Insufficient documentation

## 2022-02-12 HISTORY — DX: Polyneuropathy, unspecified: G62.9

## 2022-02-12 MED ORDER — PREDNISONE 50 MG PO TABS
60.0000 mg | ORAL_TABLET | Freq: Once | ORAL | Status: AC
  Administered 2022-02-12: 60 mg via ORAL
  Filled 2022-02-12: qty 1

## 2022-02-12 MED ORDER — PREDNISONE 10 MG (21) PO TBPK: ORAL_TABLET | ORAL | 0 refills | Status: AC

## 2022-02-12 NOTE — ED Provider Notes (Signed)
[facility]  Provider Note  CSN: [id number] Arrival date & time: 02/12/22 2109  History Chief Complaint  Patient presents with   Leg Pain    Latasha Brown is a 41 y.o. 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 Also has prior history of L sided sciatica and bulging disks. Saw Spine previously but was told she needed to complete her breast cancer therapy before considering treatment. She reports several days of increasing pain in L lower back, radiating down her leg. Worse with walking. No fevers or weakness. No incontinence. No midline back pain. This is similar to previous.    Home Medications Prior to Admission medications   Medication Sig Start Date End Date Taking? Authorizing Provider  predniSONE (STERAPRED UNI-PAK 21 TAB) 10 MG (21) TBPK tablet '10mg'$  Tabs, 6 day taper. Use as directed 02/12/22  Yes Truddie Hidden, MD  acetaminophen (TYLENOL) 325 MG tablet Take 650 mg by mouth every 6 (six) hours as needed.    [provider]  albuterol (VENTOLIN HFA) 108 (90 Base) MCG/ACT inhaler Inhale 2 puffs into the lungs daily as needed for shortness of breath. 05/12/18   [provider]  citalopram (CELEXA) 40 MG tablet Take 1 tablet by mouth daily. 03/02/19 02/25/20  [provider]  hydrOXYzine (ATARAX) 25 MG tablet Take 1 tablet (25 mg total) by mouth every 6 (six) hours. 08/20/21   Carlisle Cater, PA-C  ibuprofen (ADVIL) 200 MG tablet Take 600 mg by mouth every 6 (six) hours as needed for moderate pain.    [provider]  ibuprofen (ADVIL) 600 MG tablet Take 600 mg by mouth every 6 (six) hours as needed. 11/19/20   [provider]  levothyroxine (SYNTHROID) 175 MCG tablet Take by mouth. 08/16/20 08/16/21  [provider]  norethindrone (MICRONOR) 0.35 MG tablet Take 1 tablet by mouth daily. 05/12/18 05/12/19  [provider]  ondansetron (ZOFRAN ODT) 4 MG disintegrating tablet Take 1 tablet (4 mg total) by mouth every 8 (eight) hours as needed for nausea or vomiting. 03/11/19   Walisiewicz, Verline Lema E, PA-C  oxybutynin (DITROPAN) 5 MG tablet Take 5 mg by mouth 3 (three) times daily. 09/06/20   [provider]  SUMAtriptan (IMITREX) 50 MG tablet Take 1 tablet (50 mg total) by mouth daily as needed for migraine. Patient not taking: Reported on 12/18/2020 10/30/19   Mesner, Corene Cornea, MD  topiramate (TOPAMAX) 25 MG tablet Take 1 tablet by mouth daily. 02/02/19 02/02/20  [provider]  traMADol (ULTRAM) 50 MG tablet Take 1 tablet by mouth daily as needed for pain. 08/17/16   [provider]  traMADol Veatrice Bourbon) 50 MG tablet Take by mouth. 11/20/20   [provider]     Allergies    Trazodone and nefazodone   Review of Systems   Review of Systems Please see HPI for pertinent positives and negatives  Physical Exam BP 124/74 (BP Location: Left Arm)   Pulse 73   Temp 98.2 F (36.8 C) (Oral)   Resp 18   Ht '5\' 7"'$  (1.702 m)   Wt 120.7 kg   SpO2 97%   BMI 41.66 kg/m   Physical Exam Vitals and nursing note reviewed.  HENT:     Head: Normocephalic.     Nose: Nose normal.  Eyes:     Extraocular Movements: Extraocular movements intact.  Pulmonary:     Effort: Pulmonary  effort is normal.  Musculoskeletal:        General: Tenderness (L lumbar paraspinal muscles, no midline tenderness) present. Normal range of motion.     Cervical back: Neck supple.  Skin:    Findings: No rash (on exposed skin).  Neurological:     Mental Status: She is alert and oriented to person, place, and time.     Sensory: Sensory deficit (sensation is grossly intact, there is subjective decreased sensation lateral L leg) present.     Motor: No weakness.     Deep Tendon Reflexes: Reflexes normal.  Psychiatric:        Mood and Affect: Mood normal.     ED Results / Procedures / Treatments    EKG None  Procedures Procedures  Medications Ordered in the ED Medications  predniSONE (DELTASONE) tablet 60 mg (has no administration in time range)    Initial Impression and Plan  Patient with symptoms consistent with uncomplicated sciatica. No red flags or signs of cord compression. She reports she has responded well to steroids in the past. Will give a course of prednisone, referral back to Spine, follow up with PCP and [facility] for any other concerns.   ED Course       MDM Rules/Calculators/A&P Medical Decision Making Problems Addressed: Sciatica of left side: acute illness or injury  Risk Prescription drug management.    Final Clinical Impression(s) / ED Diagnoses Final diagnoses:  Sciatica of left side    Rx / DC Orders ED Discharge Orders          Ordered    predniSONE (STERAPRED UNI-PAK 21 TAB) 10 MG (21) TBPK tablet        02/12/22 2338             Truddie Hidden, MD 02/12/22 [phone]

## 2022-02-12 NOTE — ED Notes (Signed)
D/c paperwork reviewed with pt, including prescription. No questions or concerns at time of d/c. Pt assisted to wheelchair, wheeled to ED exit, NAD.

## 2022-02-12 NOTE — ED Triage Notes (Signed)
Pt thinks she is having nerve pain in LT leg and it is getting worse. 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 Limped into triage room with slow gait. No other complaints. Took ibuprofen at 3:30pm; tramadol at 2pm.

## 2022-03-01 IMAGING — DX DG CHEST 1V PORT
1 series · 1 of 1 positions shown · non-contrast
Comparison: None.

CLINICAL DATA: Persistent cough

EXAM:
PORTABLE CHEST 1 VIEW

[chest ap]
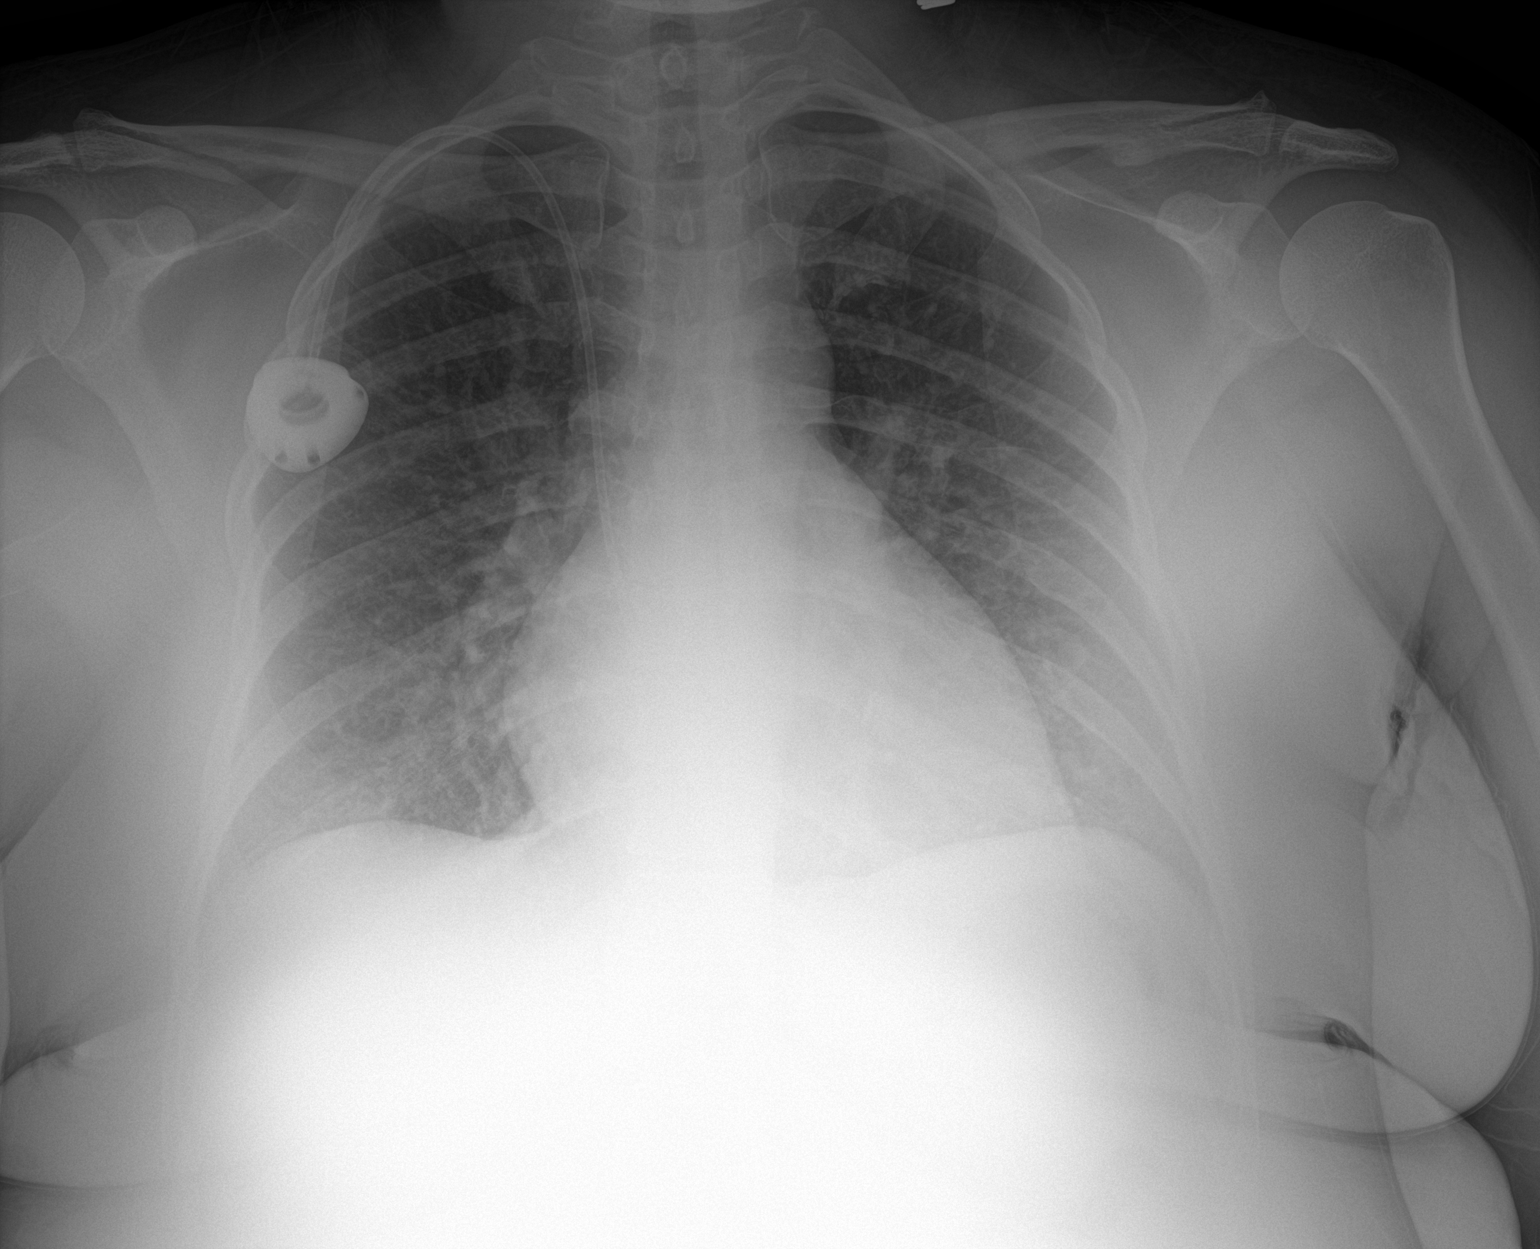

[1 of 1 positions shown; findings below may reference images not displayed]

FINDINGS: Single frontal view of the chest demonstrates right chest wall port
via internal jugular approach tip overlying superior vena cava.
Cardiac silhouette is unremarkable. No airspace disease, effusion,
or pneumothorax. No acute bony abnormalities.
IMPRESSION: 1. No acute intrathoracic process.

## 2022-08-04 ENCOUNTER — Other Ambulatory Visit (HOSPITAL_COMMUNITY): Payer: Self-pay

## 2022-08-04 MED ORDER — SUMATRIPTAN SUCCINATE 50 MG PO TABS
50.0000 mg | ORAL_TABLET | ORAL | 0 refills | Status: DC
  Filled 2022-08-04: qty 9, 30d supply, fill #0

## 2022-08-04 MED ORDER — PREDNISONE 10 MG PO TABS
ORAL_TABLET | ORAL | 0 refills | Status: AC
  Filled 2022-08-04: qty 21, 10d supply, fill #0

## 2022-08-04 MED ORDER — NAPROXEN 500 MG PO TABS
500.0000 mg | ORAL_TABLET | Freq: Two times a day (BID) | ORAL | 1 refills | Status: AC | PRN
  Filled 2022-08-04: qty 60, 30d supply, fill #0
  Filled 2022-08-31: qty 60, 30d supply, fill #1
  Filled 2022-11-01: qty 60, 30d supply, fill #2

## 2022-08-04 MED ORDER — TRAMADOL HCL 50 MG PO TABS
50.0000 mg | ORAL_TABLET | Freq: Three times a day (TID) | ORAL | 1 refills | Status: DC | PRN
  Filled 2022-08-04: qty 90, 30d supply, fill #0
  Filled 2022-08-31 – 2022-09-01 (×2): qty 90, 30d supply, fill #1

## 2022-08-31 ENCOUNTER — Other Ambulatory Visit (HOSPITAL_COMMUNITY): Payer: Self-pay

## 2022-08-31 ENCOUNTER — Ambulatory Visit: Payer: Medicaid Other | Admitting: Occupational Medicine

## 2022-08-31 MED ORDER — LEVOTHYROXINE SODIUM 125 MCG PO TABS
125.0000 ug | ORAL_TABLET | Freq: Every day | ORAL | 0 refills | Status: DC
  Filled 2022-08-31: qty 30, 30d supply, fill #0
  Filled 2022-09-01: qty 30, 30d supply, fill #1
  Filled 2022-10-25: qty 30, 30d supply, fill #2

## 2022-09-01 ENCOUNTER — Other Ambulatory Visit (HOSPITAL_COMMUNITY): Payer: Self-pay

## 2022-09-01 ENCOUNTER — Other Ambulatory Visit: Payer: Self-pay

## 2022-09-29 ENCOUNTER — Other Ambulatory Visit (HOSPITAL_COMMUNITY): Payer: Self-pay

## 2022-10-01 ENCOUNTER — Other Ambulatory Visit (HOSPITAL_COMMUNITY): Payer: Self-pay

## 2022-10-01 MED ORDER — TRAMADOL HCL 50 MG PO TABS
50.0000 mg | ORAL_TABLET | Freq: Three times a day (TID) | ORAL | 1 refills | Status: AC | PRN
  Filled 2022-10-01 – 2022-10-25 (×2): qty 90, 30d supply, fill #0
  Filled 2022-11-22 – 2022-11-23 (×2): qty 90, 30d supply, fill #1

## 2022-10-14 ENCOUNTER — Other Ambulatory Visit (HOSPITAL_COMMUNITY): Payer: Self-pay

## 2022-10-19 ENCOUNTER — Other Ambulatory Visit (HOSPITAL_COMMUNITY): Payer: Self-pay

## 2022-10-20 ENCOUNTER — Other Ambulatory Visit (HOSPITAL_COMMUNITY): Payer: Self-pay

## 2022-10-20 MED ORDER — PREDNISONE 10 MG PO TABS
ORAL_TABLET | ORAL | 0 refills | Status: AC
  Filled 2022-10-20: qty 21, 10d supply, fill #0

## 2022-10-26 ENCOUNTER — Other Ambulatory Visit: Payer: Self-pay

## 2022-10-26 ENCOUNTER — Other Ambulatory Visit (HOSPITAL_COMMUNITY): Payer: Self-pay

## 2022-11-01 ENCOUNTER — Other Ambulatory Visit (HOSPITAL_COMMUNITY): Payer: Self-pay

## 2022-11-02 ENCOUNTER — Other Ambulatory Visit (HOSPITAL_COMMUNITY): Payer: Self-pay

## 2022-11-02 ENCOUNTER — Other Ambulatory Visit: Payer: Self-pay

## 2022-11-02 MED ORDER — SUMATRIPTAN SUCCINATE 50 MG PO TABS
50.0000 mg | ORAL_TABLET | Freq: Two times a day (BID) | ORAL | 0 refills | Status: DC
  Filled 2022-11-02: qty 9, 30d supply, fill #0

## 2022-11-19 IMAGING — CT CT L SPINE W/O CM
3 of 4 series · 11 of 33 positions shown, 13 images · non-contrast
Comparison: None available.

CLINICAL DATA: Initial evaluation for progressive low back pain,
right hip pain with extension into the right lower extremity.

EXAM:
CT LUMBAR SPINE WITHOUT CONTRAST
TECHNIQUE: Multidetector CT imaging of the lumbar spine was performed without
intravenous contrast administration. Multiplanar CT image
reconstructions were also generated.

[Series 3: ax disc · axial · 0.21mm/px · z∈[+984,+1164]mm · 3 of 138 slices shown, 4 images]
[im 28/138  soft-tissue]
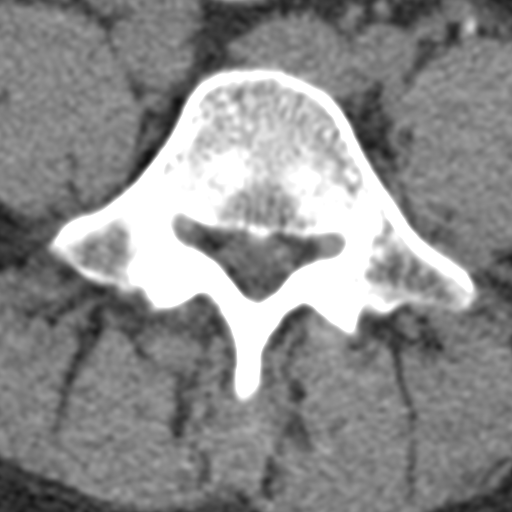
[im 28/138  bone]
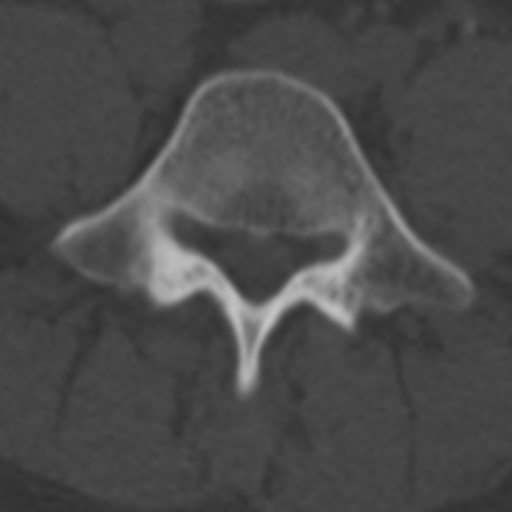
[im 83/138  bone]
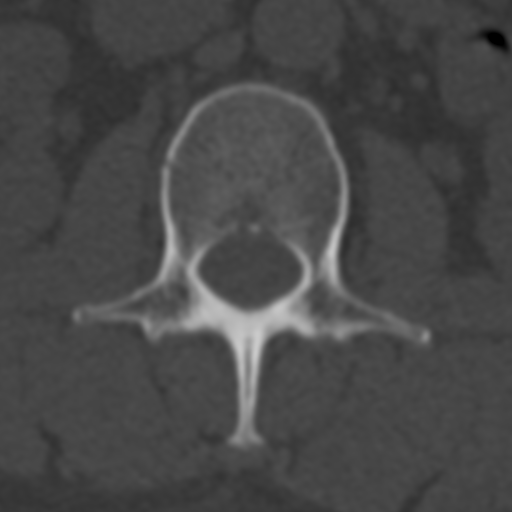
[im 110/138  bone]
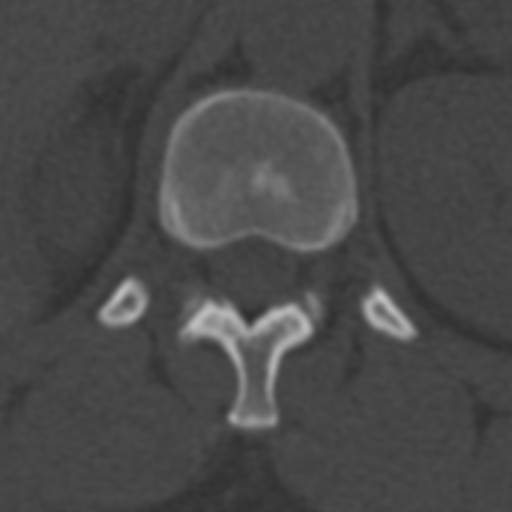

[Series 6: coronal bone · coronal · 0.39mm/px · 3 of 97 slices shown]
[im 20/97  bone]
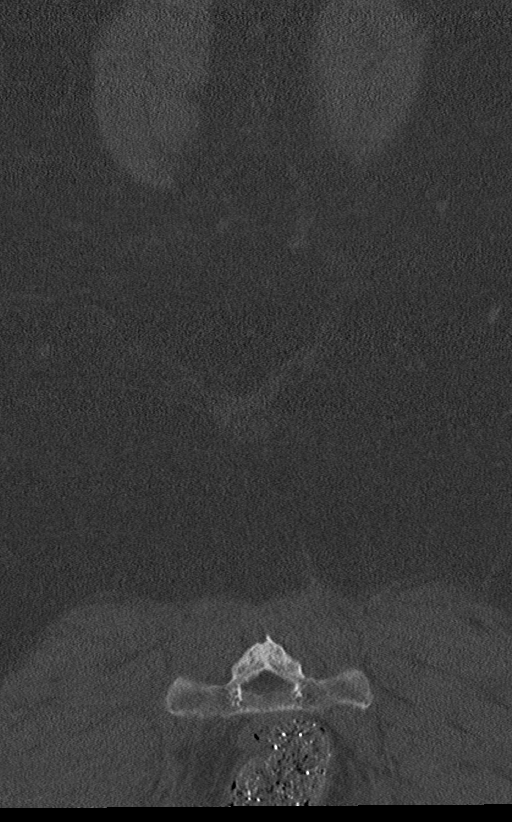
[im 39/97  bone]
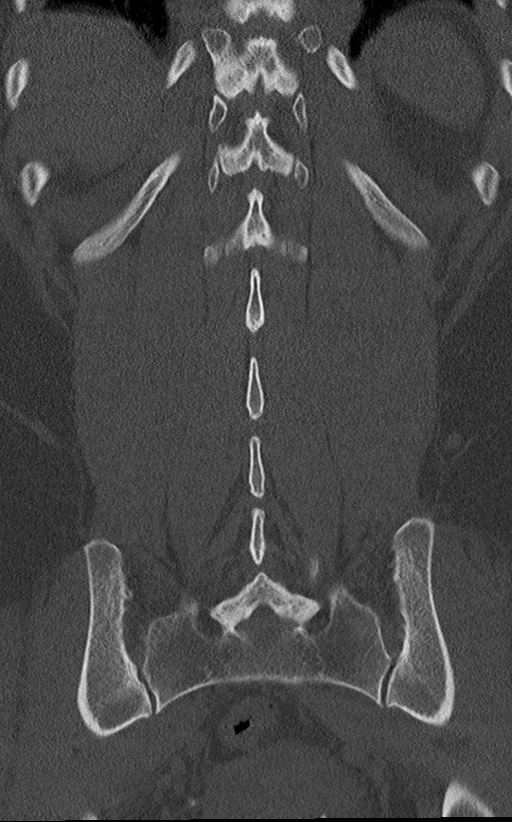
[im 58/97  bone]
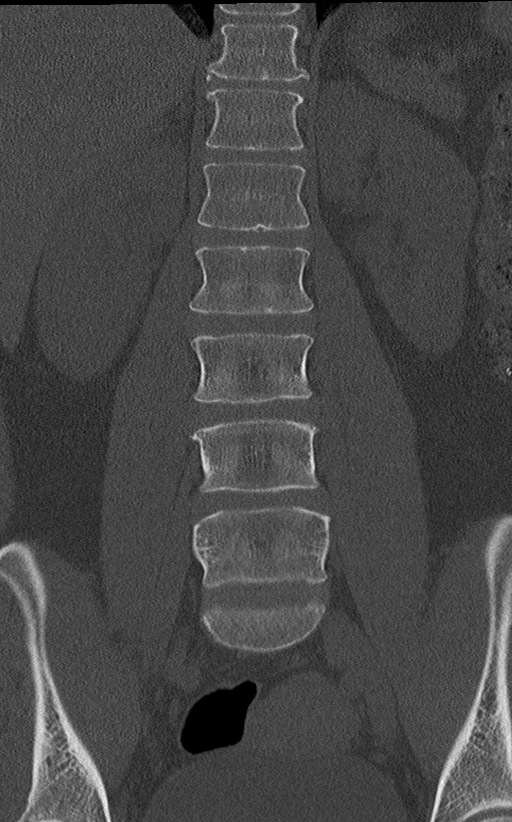

[Series 7: sagittal bone · sagittal · 0.39mm/px · 5 of 83 slices shown, 6 images]
[im 28/83  bone]
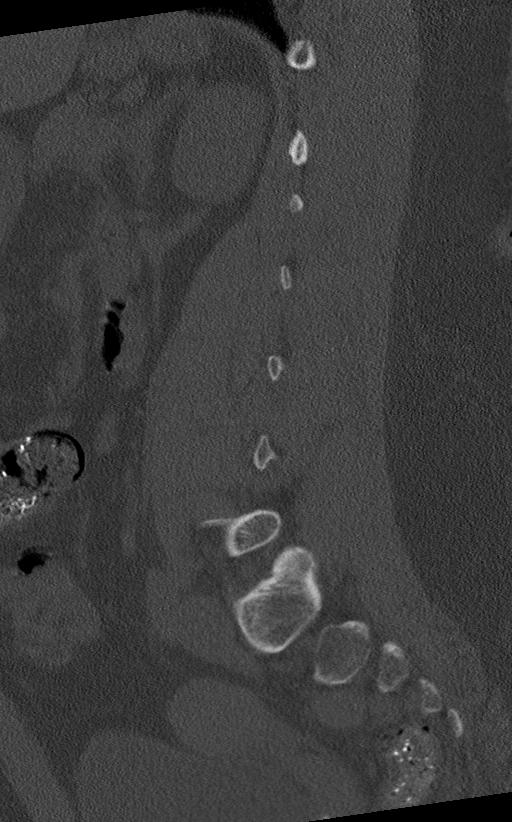
[im 35/83  bone]
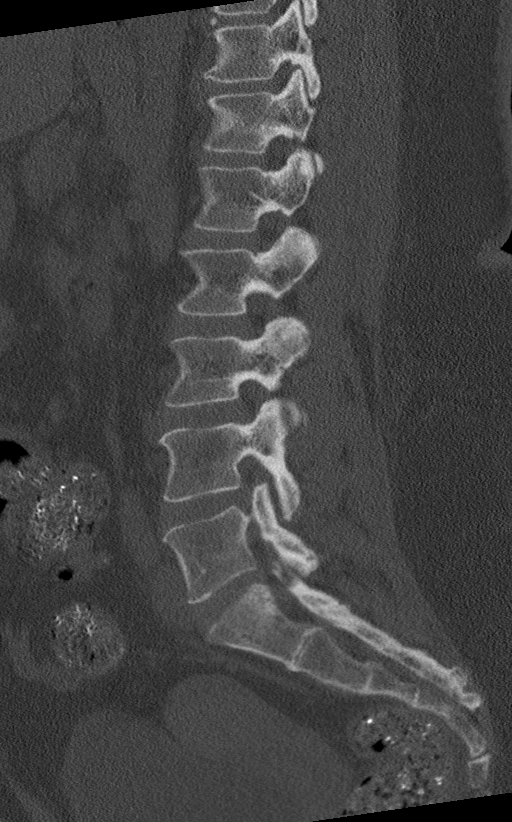
[im 42/83  soft-tissue]
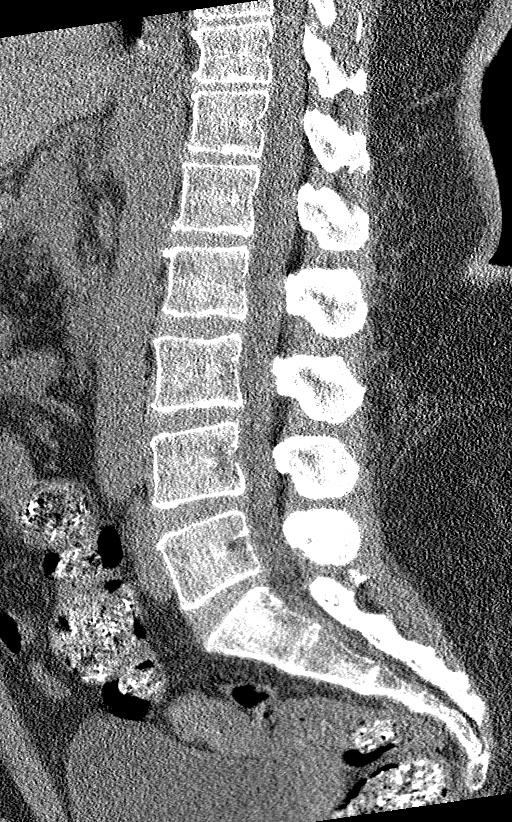
[im 42/83  bone]
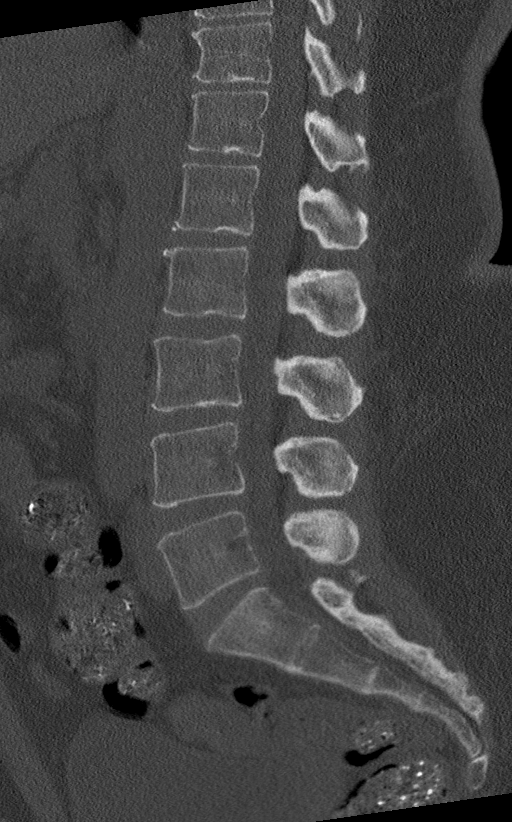
[im 48/83  bone]
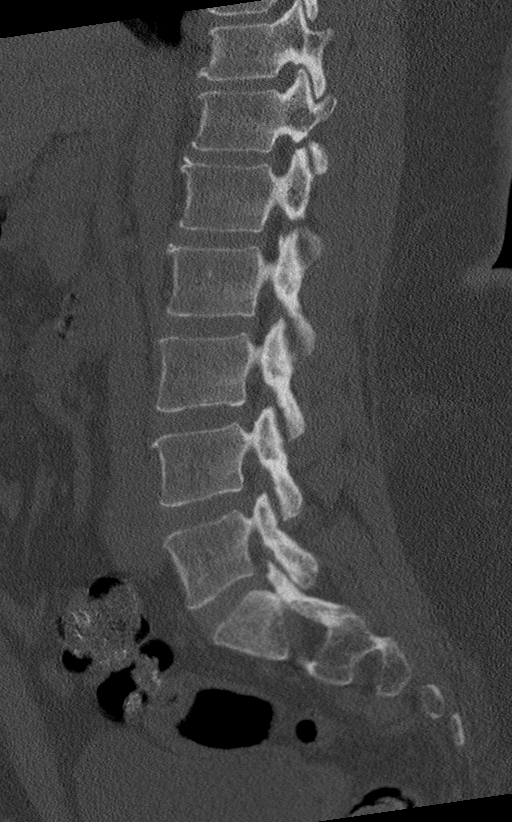
[im 55/83  bone]
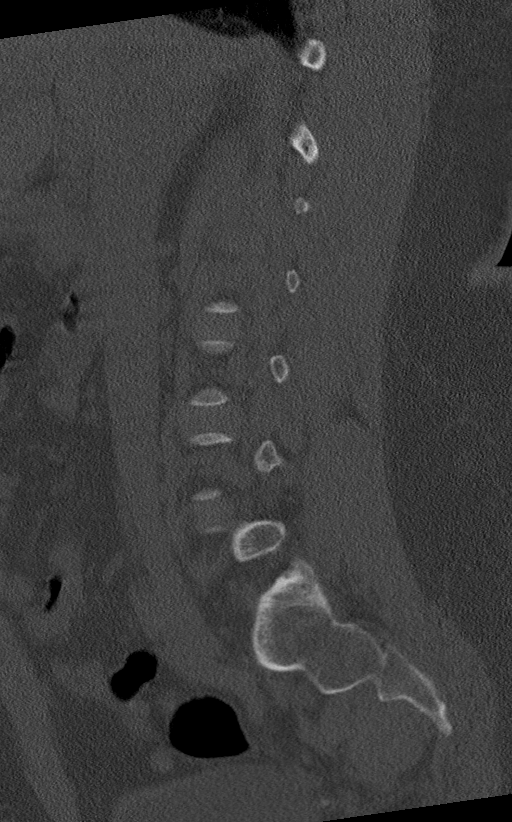

[11 of 33 positions shown; findings below may reference images not displayed]

FINDINGS: Segmentation: Standard. Lowest well-formed disc space labeled the
L5-S1 level.

Alignment: Physiologic with preservation of the normal lumbar
lordosis. No listhesis.

Vertebrae: Vertebral body height maintained without acute or chronic
fracture. Visualized sacrum and pelvis intact. SI joints symmetric
and normal. No discrete or worrisome osseous lesions.

Paraspinal and other soft tissues: Paraspinous soft tissues within
normal limits. Prior cholecystectomy noted. Moderate retained stool
within the visualized colon, which could reflect constipation.
Remainder the visualized visceral structures otherwise unremarkable.

Disc levels:

L1-2: Normal interspace. Mild bilateral facet hypertrophy. No canal
or foraminal stenosis.

L2-3: Negative interspace. Minimal facet spurring. No canal or
foraminal stenosis.

L3-4: Mild diffuse disc bulge. Superimposed shallow right foraminal
to extraforaminal disc protrusion closely approximates the exiting
right L3 nerve root (series 5, image 80). Minimal facet spurring. No
significant spinal stenosis. Mild right L3 foraminal narrowing. Left
neural foramina remains patent.

L4-5: Mild disc bulge, asymmetric to the left. Superimposed shallow
broad-based left foraminal to extraforaminal disc protrusion closely
approximates the exiting left L4 nerve root (series 5, image 96).
Mild facet hypertrophy. Probable borderline mild narrowing of the
lateral recesses bilaterally. Central canal remains patent. Mild
left L4 foraminal stenosis. Right neural foramen remains patent.

L5-S1: Mild degenerative intervertebral disc space narrowing with
shallow posterior disc bulge. Mild facet hypertrophy. No significant
canal stenosis. Foramina remain patent.
IMPRESSION: 1. No acute abnormality within the lumbar spine.
2. Small right foraminal to extraforaminal disc protrusion at L3-4,
closely approximating and potentially irritating the exiting right
L3 nerve root.
3. Left foraminal to extraforaminal disc protrusion at L4-5,
potentially affecting the exiting left L4 nerve root.
4. Moderate retained stool within the visualized colon, which could
reflect constipation.

## 2022-11-23 ENCOUNTER — Encounter (HOSPITAL_COMMUNITY): Payer: Self-pay

## 2022-11-23 ENCOUNTER — Other Ambulatory Visit (HOSPITAL_COMMUNITY): Payer: Self-pay

## 2022-11-23 MED ORDER — LEVOTHYROXINE SODIUM 125 MCG PO TABS
125.0000 ug | ORAL_TABLET | ORAL | 0 refills | Status: AC
  Filled 2022-11-23: qty 30, 30d supply, fill #0
  Filled 2022-12-21: qty 30, 30d supply, fill #1

## 2022-11-23 MED ORDER — PREDNISONE 10 MG PO TABS
ORAL_TABLET | ORAL | 0 refills | Status: AC
  Filled 2022-11-23: qty 21, 10d supply, fill #0

## 2022-11-25 ENCOUNTER — Other Ambulatory Visit (HOSPITAL_COMMUNITY): Payer: Self-pay

## 2022-11-25 MED ORDER — DULOXETINE HCL 20 MG PO CPEP
20.0000 mg | ORAL_CAPSULE | Freq: Every day | ORAL | 0 refills | Status: AC
Start: 2022-11-25 — End: ?
  Filled 2022-11-25: qty 21, 21d supply, fill #0
  Filled 2022-11-25: qty 9, 9d supply, fill #0
  Filled 2022-11-25: qty 30, 30d supply, fill #0

## 2022-12-21 ENCOUNTER — Other Ambulatory Visit (HOSPITAL_COMMUNITY): Payer: Self-pay

## 2022-12-21 MED ORDER — SUMATRIPTAN SUCCINATE 50 MG PO TABS
50.0000 mg | ORAL_TABLET | ORAL | 0 refills | Status: AC
  Filled 2022-12-21: qty 9, 30d supply, fill #0

## 2022-12-21 MED ORDER — LEVOTHYROXINE SODIUM 125 MCG PO TABS
125.0000 ug | ORAL_TABLET | Freq: Every day | ORAL | 0 refills | Status: DC
  Filled 2022-12-21: qty 30, 30d supply, fill #0

## 2022-12-22 ENCOUNTER — Other Ambulatory Visit (HOSPITAL_COMMUNITY): Payer: Self-pay

## 2022-12-22 MED ORDER — PREDNISONE 10 MG PO TABS
ORAL_TABLET | ORAL | 0 refills | Status: AC
  Filled 2022-12-22: qty 21, 10d supply, fill #0

## 2022-12-22 MED ORDER — TRAMADOL HCL 50 MG PO TABS
50.0000 mg | ORAL_TABLET | Freq: Three times a day (TID) | ORAL | 1 refills | Status: AC | PRN
Start: 2022-12-22 — End: ?
  Filled 2022-12-22: qty 21, 7d supply, fill #0
  Filled 2022-12-30 (×2): qty 69, 23d supply, fill #1
  Filled 2023-01-25: qty 90, 30d supply, fill #2

## 2022-12-30 ENCOUNTER — Other Ambulatory Visit (HOSPITAL_COMMUNITY): Payer: Self-pay

## 2023-01-18 ENCOUNTER — Other Ambulatory Visit (HOSPITAL_COMMUNITY): Payer: Self-pay

## 2023-01-18 MED ORDER — LEVOTHYROXINE SODIUM 100 MCG PO TABS
100.0000 ug | ORAL_TABLET | Freq: Every morning | ORAL | 1 refills | Status: DC
  Filled 2023-01-18: qty 30, 30d supply, fill #0
  Filled 2023-02-24: qty 30, 30d supply, fill #1

## 2023-01-25 ENCOUNTER — Other Ambulatory Visit (HOSPITAL_COMMUNITY): Payer: Self-pay

## 2023-02-16 ENCOUNTER — Other Ambulatory Visit (HOSPITAL_COMMUNITY): Payer: Self-pay

## 2023-02-16 MED ORDER — PREDNISONE 10 MG PO TABS
ORAL_TABLET | ORAL | 0 refills | Status: AC
  Filled 2023-02-16: qty 21, 10d supply, fill #0

## 2023-02-24 ENCOUNTER — Other Ambulatory Visit (HOSPITAL_COMMUNITY): Payer: Self-pay

## 2023-02-24 ENCOUNTER — Encounter (HOSPITAL_COMMUNITY): Payer: Self-pay

## 2023-03-17 ENCOUNTER — Other Ambulatory Visit (HOSPITAL_COMMUNITY): Payer: Self-pay

## 2023-03-17 MED ORDER — LEVOTHYROXINE SODIUM 100 MCG PO TABS
100.0000 ug | ORAL_TABLET | Freq: Every morning | ORAL | 1 refills | Status: DC
  Filled 2023-03-17 (×2): qty 30, 30d supply, fill #0

## 2023-03-17 MED ORDER — LEVOTHYROXINE SODIUM 100 MCG PO TABS
100.0000 ug | ORAL_TABLET | Freq: Every morning | ORAL | 1 refills | Status: AC
  Filled 2023-03-18 – 2023-03-19 (×2): qty 30, 30d supply, fill #0
  Filled ????-??-??: fill #0

## 2023-03-18 ENCOUNTER — Other Ambulatory Visit (HOSPITAL_COMMUNITY): Payer: Self-pay

## 2023-03-19 ENCOUNTER — Encounter (HOSPITAL_COMMUNITY): Payer: Self-pay

## 2023-03-19 ENCOUNTER — Other Ambulatory Visit (HOSPITAL_COMMUNITY): Payer: Self-pay

## 2023-03-31 ENCOUNTER — Other Ambulatory Visit (HOSPITAL_COMMUNITY): Payer: Self-pay
